# Patient Record
Sex: Male | Born: 1935 | Hispanic: No | Smoking: Former smoker
Health system: Southern US, Community
[De-identification: ages and names within clinical notes are randomized; demographics above are authoritative.]

## PROBLEM LIST (undated history)

## (undated) DIAGNOSIS — I10 Essential (primary) hypertension: Secondary | ICD-10-CM

## (undated) DIAGNOSIS — C801 Malignant (primary) neoplasm, unspecified: Secondary | ICD-10-CM

## (undated) HISTORY — PX: BLADDER SURGERY: SHX569

## (undated) HISTORY — PX: OTHER SURGICAL HISTORY: SHX169

---

## 2005-04-23 ENCOUNTER — Inpatient Hospital Stay (HOSPITAL_COMMUNITY): Admission: RE | Admit: 2005-04-23 | Discharge: 2005-04-26 | Payer: Self-pay | Admitting: Urology

## 2005-04-23 ENCOUNTER — Ambulatory Visit: Payer: Self-pay | Admitting: Internal Medicine

## 2010-12-19 ENCOUNTER — Encounter (INDEPENDENT_AMBULATORY_CARE_PROVIDER_SITE_OTHER): Payer: Medicare Other | Admitting: Ophthalmology

## 2010-12-19 DIAGNOSIS — H43819 Vitreous degeneration, unspecified eye: Secondary | ICD-10-CM

## 2010-12-19 DIAGNOSIS — H3530 Unspecified macular degeneration: Secondary | ICD-10-CM

## 2010-12-19 DIAGNOSIS — H353290 Exudative age-related macular degeneration, unspecified eye, stage unspecified: Secondary | ICD-10-CM

## 2011-02-27 ENCOUNTER — Encounter (INDEPENDENT_AMBULATORY_CARE_PROVIDER_SITE_OTHER): Payer: Medicare Other | Admitting: Ophthalmology

## 2011-02-27 DIAGNOSIS — H353290 Exudative age-related macular degeneration, unspecified eye, stage unspecified: Secondary | ICD-10-CM

## 2011-02-27 DIAGNOSIS — H3530 Unspecified macular degeneration: Secondary | ICD-10-CM

## 2011-02-27 DIAGNOSIS — H43819 Vitreous degeneration, unspecified eye: Secondary | ICD-10-CM

## 2011-05-08 ENCOUNTER — Encounter (INDEPENDENT_AMBULATORY_CARE_PROVIDER_SITE_OTHER): Payer: Medicare Other | Admitting: Ophthalmology

## 2011-05-09 ENCOUNTER — Encounter (INDEPENDENT_AMBULATORY_CARE_PROVIDER_SITE_OTHER): Payer: Medicare Other | Admitting: Ophthalmology

## 2011-05-09 DIAGNOSIS — H43819 Vitreous degeneration, unspecified eye: Secondary | ICD-10-CM

## 2011-05-09 DIAGNOSIS — H353290 Exudative age-related macular degeneration, unspecified eye, stage unspecified: Secondary | ICD-10-CM

## 2011-05-09 DIAGNOSIS — H3530 Unspecified macular degeneration: Secondary | ICD-10-CM

## 2011-07-18 ENCOUNTER — Encounter (INDEPENDENT_AMBULATORY_CARE_PROVIDER_SITE_OTHER): Payer: Medicare Other | Admitting: Ophthalmology

## 2011-07-18 DIAGNOSIS — H3530 Unspecified macular degeneration: Secondary | ICD-10-CM

## 2011-07-18 DIAGNOSIS — H353290 Exudative age-related macular degeneration, unspecified eye, stage unspecified: Secondary | ICD-10-CM

## 2011-07-18 DIAGNOSIS — H43819 Vitreous degeneration, unspecified eye: Secondary | ICD-10-CM

## 2011-09-19 ENCOUNTER — Encounter (INDEPENDENT_AMBULATORY_CARE_PROVIDER_SITE_OTHER): Payer: Medicare Other | Admitting: Ophthalmology

## 2011-09-19 DIAGNOSIS — H2700 Aphakia, unspecified eye: Secondary | ICD-10-CM

## 2011-09-19 DIAGNOSIS — H353290 Exudative age-related macular degeneration, unspecified eye, stage unspecified: Secondary | ICD-10-CM

## 2011-09-19 DIAGNOSIS — H43819 Vitreous degeneration, unspecified eye: Secondary | ICD-10-CM

## 2011-09-19 DIAGNOSIS — H3530 Unspecified macular degeneration: Secondary | ICD-10-CM

## 2011-11-21 ENCOUNTER — Encounter (INDEPENDENT_AMBULATORY_CARE_PROVIDER_SITE_OTHER): Payer: Medicare Other | Admitting: Ophthalmology

## 2011-11-21 DIAGNOSIS — H43819 Vitreous degeneration, unspecified eye: Secondary | ICD-10-CM

## 2011-11-21 DIAGNOSIS — H3530 Unspecified macular degeneration: Secondary | ICD-10-CM

## 2011-11-21 DIAGNOSIS — H353290 Exudative age-related macular degeneration, unspecified eye, stage unspecified: Secondary | ICD-10-CM

## 2012-01-23 ENCOUNTER — Encounter (INDEPENDENT_AMBULATORY_CARE_PROVIDER_SITE_OTHER): Payer: Medicare Other | Admitting: Ophthalmology

## 2012-01-23 DIAGNOSIS — H353290 Exudative age-related macular degeneration, unspecified eye, stage unspecified: Secondary | ICD-10-CM

## 2012-01-23 DIAGNOSIS — H3530 Unspecified macular degeneration: Secondary | ICD-10-CM

## 2012-01-23 DIAGNOSIS — H43819 Vitreous degeneration, unspecified eye: Secondary | ICD-10-CM

## 2012-03-26 ENCOUNTER — Encounter (INDEPENDENT_AMBULATORY_CARE_PROVIDER_SITE_OTHER): Payer: Medicare Other | Admitting: Ophthalmology

## 2012-03-26 DIAGNOSIS — H43819 Vitreous degeneration, unspecified eye: Secondary | ICD-10-CM

## 2012-03-26 DIAGNOSIS — H3530 Unspecified macular degeneration: Secondary | ICD-10-CM

## 2012-03-26 DIAGNOSIS — H353290 Exudative age-related macular degeneration, unspecified eye, stage unspecified: Secondary | ICD-10-CM

## 2012-05-28 ENCOUNTER — Encounter (INDEPENDENT_AMBULATORY_CARE_PROVIDER_SITE_OTHER): Payer: Medicare Other | Admitting: Ophthalmology

## 2012-05-28 DIAGNOSIS — H353290 Exudative age-related macular degeneration, unspecified eye, stage unspecified: Secondary | ICD-10-CM

## 2012-05-28 DIAGNOSIS — H43819 Vitreous degeneration, unspecified eye: Secondary | ICD-10-CM

## 2012-05-28 DIAGNOSIS — H3530 Unspecified macular degeneration: Secondary | ICD-10-CM

## 2012-07-30 ENCOUNTER — Encounter (INDEPENDENT_AMBULATORY_CARE_PROVIDER_SITE_OTHER): Payer: Medicare Other | Admitting: Ophthalmology

## 2012-08-06 ENCOUNTER — Encounter (INDEPENDENT_AMBULATORY_CARE_PROVIDER_SITE_OTHER): Payer: Medicare Other | Admitting: Ophthalmology

## 2012-08-06 DIAGNOSIS — H3530 Unspecified macular degeneration: Secondary | ICD-10-CM

## 2012-08-06 DIAGNOSIS — H353290 Exudative age-related macular degeneration, unspecified eye, stage unspecified: Secondary | ICD-10-CM

## 2012-10-15 ENCOUNTER — Encounter (INDEPENDENT_AMBULATORY_CARE_PROVIDER_SITE_OTHER): Payer: Medicare Other | Admitting: Ophthalmology

## 2012-10-15 DIAGNOSIS — H3530 Unspecified macular degeneration: Secondary | ICD-10-CM

## 2012-10-15 DIAGNOSIS — H353290 Exudative age-related macular degeneration, unspecified eye, stage unspecified: Secondary | ICD-10-CM

## 2012-10-15 DIAGNOSIS — H43819 Vitreous degeneration, unspecified eye: Secondary | ICD-10-CM

## 2012-12-24 ENCOUNTER — Encounter (INDEPENDENT_AMBULATORY_CARE_PROVIDER_SITE_OTHER): Payer: Medicare Other | Admitting: Ophthalmology

## 2012-12-24 DIAGNOSIS — H3530 Unspecified macular degeneration: Secondary | ICD-10-CM

## 2012-12-24 DIAGNOSIS — H43819 Vitreous degeneration, unspecified eye: Secondary | ICD-10-CM

## 2012-12-24 DIAGNOSIS — H353290 Exudative age-related macular degeneration, unspecified eye, stage unspecified: Secondary | ICD-10-CM

## 2012-12-29 ENCOUNTER — Encounter (INDEPENDENT_AMBULATORY_CARE_PROVIDER_SITE_OTHER): Payer: Medicare Other | Admitting: Ophthalmology

## 2013-03-04 ENCOUNTER — Encounter (INDEPENDENT_AMBULATORY_CARE_PROVIDER_SITE_OTHER): Payer: Medicare Other | Admitting: Ophthalmology

## 2013-03-04 DIAGNOSIS — H353290 Exudative age-related macular degeneration, unspecified eye, stage unspecified: Secondary | ICD-10-CM

## 2013-03-04 DIAGNOSIS — H3530 Unspecified macular degeneration: Secondary | ICD-10-CM

## 2013-03-04 DIAGNOSIS — H43819 Vitreous degeneration, unspecified eye: Secondary | ICD-10-CM

## 2013-05-13 ENCOUNTER — Encounter (INDEPENDENT_AMBULATORY_CARE_PROVIDER_SITE_OTHER): Payer: Medicare Other | Admitting: Ophthalmology

## 2013-05-13 DIAGNOSIS — H43819 Vitreous degeneration, unspecified eye: Secondary | ICD-10-CM

## 2013-05-13 DIAGNOSIS — H3530 Unspecified macular degeneration: Secondary | ICD-10-CM

## 2013-05-13 DIAGNOSIS — H353290 Exudative age-related macular degeneration, unspecified eye, stage unspecified: Secondary | ICD-10-CM

## 2013-07-26 ENCOUNTER — Emergency Department (HOSPITAL_COMMUNITY): Payer: Medicare Other

## 2013-07-26 ENCOUNTER — Inpatient Hospital Stay (HOSPITAL_COMMUNITY)
Admission: EM | Admit: 2013-07-26 | Discharge: 2013-07-28 | DRG: 066 | Disposition: A | Discharge status: Home / Self Care | Payer: Medicare Other | Attending: Internal Medicine | Admitting: Internal Medicine

## 2013-07-26 DIAGNOSIS — E785 Hyperlipidemia, unspecified: Secondary | ICD-10-CM | POA: Diagnosis present

## 2013-07-26 DIAGNOSIS — N289 Disorder of kidney and ureter, unspecified: Secondary | ICD-10-CM | POA: Diagnosis present

## 2013-07-26 DIAGNOSIS — Z66 Do not resuscitate: Secondary | ICD-10-CM | POA: Diagnosis present

## 2013-07-26 DIAGNOSIS — M7989 Other specified soft tissue disorders: Secondary | ICD-10-CM | POA: Diagnosis present

## 2013-07-26 DIAGNOSIS — Z8546 Personal history of malignant neoplasm of prostate: Secondary | ICD-10-CM

## 2013-07-26 DIAGNOSIS — I4891 Unspecified atrial fibrillation: Secondary | ICD-10-CM | POA: Diagnosis present

## 2013-07-26 DIAGNOSIS — I10 Essential (primary) hypertension: Secondary | ICD-10-CM | POA: Diagnosis present

## 2013-07-26 DIAGNOSIS — I639 Cerebral infarction, unspecified: Secondary | ICD-10-CM | POA: Diagnosis present

## 2013-07-26 DIAGNOSIS — I6350 Cerebral infarction due to unspecified occlusion or stenosis of unspecified cerebral artery: Principal | ICD-10-CM | POA: Diagnosis present

## 2013-07-26 DIAGNOSIS — Z87891 Personal history of nicotine dependence: Secondary | ICD-10-CM

## 2013-07-26 DIAGNOSIS — D649 Anemia, unspecified: Secondary | ICD-10-CM | POA: Diagnosis present

## 2013-07-26 HISTORY — DX: Essential (primary) hypertension: I10

## 2013-07-26 HISTORY — DX: Malignant (primary) neoplasm, unspecified: C80.1

## 2013-07-26 LAB — I-STAT CHEM 8, ED
BUN: 24 mg/dL — ABNORMAL HIGH (ref 6–23)
CHLORIDE: 97 meq/L (ref 96–112)
CREATININE: 1.5 mg/dL — AB (ref 0.50–1.35)
Calcium, Ion: 1.17 mmol/L (ref 1.13–1.30)
Glucose, Bld: 109 mg/dL — ABNORMAL HIGH (ref 70–99)
HEMATOCRIT: 34 % — AB (ref 39.0–52.0)
HEMOGLOBIN: 11.6 g/dL — AB (ref 13.0–17.0)
Potassium: 4.4 mEq/L (ref 3.7–5.3)
SODIUM: 137 meq/L (ref 137–147)
TCO2: 28 mmol/L (ref 0–100)

## 2013-07-26 LAB — I-STAT TROPONIN, ED: Troponin i, poc: 0 ng/mL (ref 0.00–0.08)

## 2013-07-26 NOTE — ED Notes (Signed)
Pt was watching TV around 830 last night, and was trying to change the channel while trying to watch the news.  He noticed he couldn't use his right hand.

## 2013-07-26 NOTE — ED Notes (Signed)
Dr. Florina Ou at the bedside.

## 2013-07-26 NOTE — ED Notes (Signed)
Dr. Kirkpatrick at the bedside.  

## 2013-07-26 NOTE — ED Notes (Signed)
Patient reporting feeling weak on right side since around 9pm.  Pt has drift on right upper arm.  Able to follow commands. ER MD at the bedside.

## 2013-07-26 NOTE — ED Provider Notes (Signed)
CSN: [id number]     Arrival date & time 07/26/13  2311 History   None    Chief Complaint  Patient presents with  . Code Stroke     (Consider location/radiation/quality/duration/timing/severity/associated sxs/prior Treatment) HPI This is a 80 year old male with a history of atrial fibrillation. He is not on any anticoagulants. He is here with right upper extremity weakness onset about 2 hours ago. He was watching TV and realized he could no longer operate the remote control. He states his right upper extremity also felt mom. Symptoms have somewhat resolved but he is not back to baseline. There is no specific exacerbating or mitigating factor. He denies headache, visual changes, nausea, vomiting or diarrhea. He denies chest pain or shortness of breath. His daughter states he has been less active the past few days than usual.  History reviewed. No pertinent past medical history. History reviewed. No pertinent past surgical history. History reviewed. No pertinent family history. History  Substance Use Topics  . Smoking status: Never Smoker   . Smokeless tobacco: Not on file  . Alcohol Use: No    Review of Systems  All other systems reviewed and are negative.   Allergies  Review of patient's allergies indicates no known allergies.  Home Medications   Prior to Admission medications   Medication Sig Start Date End Date Taking? Authorizing Provider  beta carotene w/minerals (OCUVITE) tablet Take 2 tablets by mouth daily.   Yes Historical Provider, MD   BP 178/88  Pulse 76  Temp(Src) 97.7 F (36.5 C) (Oral)  Resp 13  SpO2 99%  Physical Exam General: Well-developed, well-nourished male in no acute distress; appearance consistent with age of record HENT: normocephalic; atraumatic Eyes: Right pupil slightly larger than left pupil, pupils sluggish; extraocular muscles intact Neck: supple Heart: Irregular rhythm Lungs: clear to auscultation bilaterally Abdomen: soft; nondistended;  nontender; bowel sounds present Extremities: Arthritic changes; pulses normal Neurologic: Awake, alert and oriented; strength +5 out of 5 in lower extremities and left upper extremity, strength was 4/5 right upper extremity; no facial droop, tongue midline; right pronator drift Skin: Warm and dry Psychiatric: Normal mood and affect   ED Course  Procedures (including critical care time)  MDM    EKG Interpretation  Date/Time:  Monday Jul 26 2013 23:18:51 EDT Ventricular Rate:  89 PR Interval:    QRS Duration: 75 QT Interval:  351 QTC Calculation: 427 R Axis:   50 Text Interpretation:  Atrial fibrillation No significant change since Confirmed by Eugena Rhue  MD, Jenny Reichmann ([id number]) on 07/26/2013 11:24:37 PM      1:18 AM Code stroke paged at 11:24 PM. Neurologist at bedside.  Nursing notes and vitals signs, including pulse oximetry, reviewed.  Summary of this visit's results, reviewed by myself:  Labs:  Results for orders placed during the hospital encounter of 07/26/13 (from the past 24 hour(s))  ETHANOL     Status: None   Collection Time    07/26/13 11:42 PM      Result Value Ref Range   Alcohol, Ethyl (B) <11  0 - 11 mg/dL  PROTIME-INR     Status: None   Collection Time    07/26/13 11:42 PM      Result Value Ref Range   Prothrombin Time 13.1  11.6 - 15.2 seconds   INR 1.01  0.00 - 1.49  APTT     Status: None   Collection Time    07/26/13 11:42 PM      Result Value Ref Range  aPTT 33  24 - 37 seconds  CBC     Status: Abnormal   Collection Time    07/26/13 11:42 PM      Result Value Ref Range   WBC 5.5  4.0 - 10.5 K/uL   RBC 3.86 (*) 4.22 - 5.81 MIL/uL   Hemoglobin 10.3 (*) 13.0 - 17.0 g/dL   HCT 32.7 (*) 39.0 - 52.0 %   MCV 84.7  78.0 - 100.0 fL   MCH 26.7  26.0 - 34.0 pg   MCHC 31.5  30.0 - 36.0 g/dL   RDW 15.2  11.5 - 15.5 %   Platelets 261  150 - 400 K/uL  DIFFERENTIAL     Status: None   Collection Time    07/26/13 11:42 PM      Result Value Ref Range    Neutrophils Relative % 66  43 - 77 %   Neutro Abs 3.6  1.7 - 7.7 K/uL   Lymphocytes Relative 20  12 - 46 %   Lymphs Abs 1.1  0.7 - 4.0 K/uL   Monocytes Relative 11  3 - 12 %   Monocytes Absolute 0.6  0.1 - 1.0 K/uL   Eosinophils Relative 2  0 - 5 %   Eosinophils Absolute 0.1  0.0 - 0.7 K/uL   Basophils Relative 1  0 - 1 %   Basophils Absolute 0.1  0.0 - 0.1 K/uL  COMPREHENSIVE METABOLIC PANEL     Status: Abnormal   Collection Time    07/26/13 11:42 PM      Result Value Ref Range   Sodium 135 (*) 137 - 147 mEq/L   Potassium 4.3  3.7 - 5.3 mEq/L   Chloride 95 (*) 96 - 112 mEq/L   CO2 27  19 - 32 mEq/L   Glucose, Bld 106 (*) 70 - 99 mg/dL   BUN 21  6 - 23 mg/dL   Creatinine, Ser 1.23  0.50 - 1.35 mg/dL   Calcium 9.2  8.4 - 10.5 mg/dL   Total Protein 7.3  6.0 - 8.3 g/dL   Albumin 3.5  3.5 - 5.2 g/dL   AST 22  0 - 37 U/L   ALT 16  0 - 53 U/L   Alkaline Phosphatase 78  39 - 117 U/L   Total Bilirubin 0.4  0.3 - 1.2 mg/dL   GFR calc non Af Amer 49 (*) >90 mL/min   GFR calc Af Amer 57 (*) >90 mL/min  I-STAT TROPOININ, ED     Status: None   Collection Time    07/26/13 11:50 PM      Result Value Ref Range   Troponin i, poc 0.00  0.00 - 0.08 ng/mL   Comment 3           I-STAT CHEM 8, ED     Status: Abnormal   Collection Time    07/26/13 11:52 PM      Result Value Ref Range   Sodium 137  137 - 147 mEq/L   Potassium 4.4  3.7 - 5.3 mEq/L   Chloride 97  96 - 112 mEq/L   BUN 24 (*) 6 - 23 mg/dL   Creatinine, Ser 1.50 (*) 0.50 - 1.35 mg/dL   Glucose, Bld 109 (*) 70 - 99 mg/dL   Calcium, Ion 1.17  1.13 - 1.30 mmol/L   TCO2 28  0 - 100 mmol/L   Hemoglobin 11.6 (*) 13.0 - 17.0 g/dL   HCT 34.0 (*) 39.0 - 52.0 %  URINE RAPID DRUG SCREEN (HOSP PERFORMED)     Status: None   Collection Time    07/27/13 12:15 AM      Result Value Ref Range   Opiates NONE DETECTED  NONE DETECTED   Cocaine NONE DETECTED  NONE DETECTED   Benzodiazepines NONE DETECTED  NONE DETECTED   Amphetamines NONE  DETECTED  NONE DETECTED   Tetrahydrocannabinol NONE DETECTED  NONE DETECTED   Barbiturates NONE DETECTED  NONE DETECTED  URINALYSIS, ROUTINE W REFLEX MICROSCOPIC     Status: None   Collection Time    07/27/13 12:15 AM      Result Value Ref Range   Color, Urine YELLOW  YELLOW   APPearance CLEAR  CLEAR   Specific Gravity, Urine 1.009  1.005 - 1.030   pH 7.0  5.0 - 8.0   Glucose, UA NEGATIVE  NEGATIVE mg/dL   Hgb urine dipstick NEGATIVE  NEGATIVE   Bilirubin Urine NEGATIVE  NEGATIVE   Ketones, ur NEGATIVE  NEGATIVE mg/dL   Protein, ur NEGATIVE  NEGATIVE mg/dL   Urobilinogen, UA 1.0  0.0 - 1.0 mg/dL   Nitrite NEGATIVE  NEGATIVE   Leukocytes, UA NEGATIVE  NEGATIVE    Imaging Studies: Ct Head Wo Contrast  07/26/2013   CLINICAL DATA:  Code stroke.  Right-sided weakness.  EXAM: CT HEAD WITHOUT CONTRAST  TECHNIQUE: Contiguous axial images were obtained from the base of the skull through the vertex without intravenous contrast.  COMPARISON:  None.  FINDINGS: Skull and Sinuses:No acute osseous findings. Degenerative cystic change within the right mandibular condyle.  Orbits: Bilateral cataract resection.  Brain: No evidence of acute abnormality, such as acute infarction, hemorrhage, hydrocephalus, or mass lesion/mass effect. Chronic small vessel disease with ischemic gliosis confluent around the lateral ventricles. Lacunar insults in the posterior right internal capsule and left putamen. Generalized cerebral volume loss, age appropriate.  These results were called by telephone at the time of interpretation on 07/26/2013 at 11:42 PM to Dr. Leonel Ramsay, who verbally acknowledged these results.  IMPRESSION: 1. Negative for intracranial hemorrhage or major vessel infarct. 2. Chronic small vessel disease.   Electronically Signed   By: Jorje Guild M.D.   On: 07/26/2013 23:49       Wynetta Fines, MD 07/27/13 [phone]

## 2013-07-26 NOTE — ED Notes (Signed)
Patient returned from CT. Phlebotomy at the bedside.  

## 2013-07-27 ENCOUNTER — Inpatient Hospital Stay (HOSPITAL_COMMUNITY): Payer: Medicare Other

## 2013-07-27 ENCOUNTER — Encounter (HOSPITAL_COMMUNITY): Payer: Self-pay | Admitting: Emergency Medicine

## 2013-07-27 DIAGNOSIS — I6350 Cerebral infarction due to unspecified occlusion or stenosis of unspecified cerebral artery: Principal | ICD-10-CM

## 2013-07-27 DIAGNOSIS — I359 Nonrheumatic aortic valve disorder, unspecified: Secondary | ICD-10-CM

## 2013-07-27 DIAGNOSIS — I4891 Unspecified atrial fibrillation: Secondary | ICD-10-CM

## 2013-07-27 DIAGNOSIS — D649 Anemia, unspecified: Secondary | ICD-10-CM

## 2013-07-27 DIAGNOSIS — M7989 Other specified soft tissue disorders: Secondary | ICD-10-CM | POA: Diagnosis present

## 2013-07-27 DIAGNOSIS — I10 Essential (primary) hypertension: Secondary | ICD-10-CM | POA: Diagnosis present

## 2013-07-27 DIAGNOSIS — I639 Cerebral infarction, unspecified: Secondary | ICD-10-CM | POA: Diagnosis present

## 2013-07-27 LAB — COMPREHENSIVE METABOLIC PANEL
ALBUMIN: 3.5 g/dL (ref 3.5–5.2)
ALK PHOS: 78 U/L (ref 39–117)
ALT: 16 U/L (ref 0–53)
AST: 22 U/L (ref 0–37)
BUN: 21 mg/dL (ref 6–23)
CALCIUM: 9.2 mg/dL (ref 8.4–10.5)
CHLORIDE: 95 meq/L — AB (ref 96–112)
CO2: 27 meq/L (ref 19–32)
Creatinine, Ser: 1.23 mg/dL (ref 0.50–1.35)
GFR, EST AFRICAN AMERICAN: 57 mL/min — AB (ref >90)
GFR, EST NON AFRICAN AMERICAN: 49 mL/min — AB (ref >90)
Glucose, Bld: 106 mg/dL — ABNORMAL HIGH (ref 70–99)
Potassium: 4.3 mEq/L (ref 3.7–5.3)
SODIUM: 135 meq/L — AB (ref 137–147)
Total Bilirubin: 0.4 mg/dL (ref 0.3–1.2)
Total Protein: 7.3 g/dL (ref 6.0–8.3)

## 2013-07-27 LAB — BASIC METABOLIC PANEL
BUN: 24 mg/dL — ABNORMAL HIGH (ref 6–23)
CALCIUM: 8.8 mg/dL (ref 8.4–10.5)
CO2: 24 meq/L (ref 19–32)
Chloride: 98 mEq/L (ref 96–112)
Creatinine, Ser: 1.17 mg/dL (ref 0.50–1.35)
GFR calc Af Amer: 60 mL/min — ABNORMAL LOW (ref >90)
GFR, EST NON AFRICAN AMERICAN: 52 mL/min — AB (ref >90)
Glucose, Bld: 159 mg/dL — ABNORMAL HIGH (ref 70–99)
Potassium: 4.7 mEq/L (ref 3.7–5.3)
SODIUM: 135 meq/L — AB (ref 137–147)

## 2013-07-27 LAB — ETHANOL: Alcohol, Ethyl (B): <11 mg/dL (ref 0–11)

## 2013-07-27 LAB — URINALYSIS, ROUTINE W REFLEX MICROSCOPIC
Bilirubin Urine: NEGATIVE
Glucose, UA: NEGATIVE mg/dL
HGB URINE DIPSTICK: NEGATIVE
Ketones, ur: NEGATIVE mg/dL
Leukocytes, UA: NEGATIVE
Nitrite: NEGATIVE
PH: 7 (ref 5.0–8.0)
PROTEIN: NEGATIVE mg/dL
SPECIFIC GRAVITY, URINE: 1.009 (ref 1.005–1.030)
Urobilinogen, UA: 1 mg/dL (ref 0.0–1.0)

## 2013-07-27 LAB — CBC
HCT: 32.7 % — ABNORMAL LOW (ref 39.0–52.0)
Hemoglobin: 10.3 g/dL — ABNORMAL LOW (ref 13.0–17.0)
MCH: 26.7 pg (ref 26.0–34.0)
MCHC: 31.5 g/dL (ref 30.0–36.0)
MCV: 84.7 fL (ref 78.0–100.0)
Platelets: 261 10*3/uL (ref 150–400)
RBC: 3.86 MIL/uL — ABNORMAL LOW (ref 4.22–5.81)
RDW: 15.2 % (ref 11.5–15.5)
WBC: 5.5 10*3/uL (ref 4.0–10.5)

## 2013-07-27 LAB — HEMOGLOBIN A1C
Hgb A1c MFr Bld: 6 % — ABNORMAL HIGH (ref <5.7)
Mean Plasma Glucose: 126 mg/dL — ABNORMAL HIGH (ref <117)

## 2013-07-27 LAB — DIFFERENTIAL
BASOS ABS: 0.1 10*3/uL (ref 0.0–0.1)
BASOS PCT: 1 % (ref 0–1)
Eosinophils Absolute: 0.1 10*3/uL (ref 0.0–0.7)
Eosinophils Relative: 2 % (ref 0–5)
Lymphocytes Relative: 20 % (ref 12–46)
Lymphs Abs: 1.1 10*3/uL (ref 0.7–4.0)
Monocytes Absolute: 0.6 10*3/uL (ref 0.1–1.0)
Monocytes Relative: 11 % (ref 3–12)
NEUTROS ABS: 3.6 10*3/uL (ref 1.7–7.7)
NEUTROS PCT: 66 % (ref 43–77)

## 2013-07-27 LAB — TROPONIN I: Troponin I: <0.3 ng/mL (ref <0.30)

## 2013-07-27 LAB — LIPID PANEL
Cholesterol: 125 mg/dL (ref 0–200)
HDL: 33 mg/dL — ABNORMAL LOW (ref >39)
LDL Cholesterol: 78 mg/dL (ref 0–99)
Total CHOL/HDL Ratio: 3.8 RATIO
Triglycerides: 72 mg/dL (ref <150)
VLDL: 14 mg/dL (ref 0–40)

## 2013-07-27 LAB — FERRITIN: Ferritin: 60 ng/mL (ref 22–322)

## 2013-07-27 LAB — RAPID URINE DRUG SCREEN, HOSP PERFORMED
AMPHETAMINES: NOT DETECTED
BENZODIAZEPINES: NOT DETECTED
Barbiturates: NOT DETECTED
COCAINE: NOT DETECTED
OPIATES: NOT DETECTED
TETRAHYDROCANNABINOL: NOT DETECTED

## 2013-07-27 LAB — TSH: TSH: 3.58 u[IU]/mL (ref 0.350–4.500)

## 2013-07-27 LAB — APTT: APTT: 33 s (ref 24–37)

## 2013-07-27 LAB — PROTIME-INR
INR: 1.01 (ref 0.00–1.49)
Prothrombin Time: 13.1 seconds (ref 11.6–15.2)

## 2013-07-27 LAB — RETICULOCYTES
RBC.: 3.79 MIL/uL — ABNORMAL LOW (ref 4.22–5.81)
RETIC COUNT ABSOLUTE: 79.6 10*3/uL (ref 19.0–186.0)
Retic Ct Pct: 2.1 % (ref 0.4–3.1)

## 2013-07-27 LAB — VITAMIN B12: Vitamin B-12: 438 pg/mL (ref 211–911)

## 2013-07-27 LAB — FOLATE

## 2013-07-27 MED ORDER — ASPIRIN EC 325 MG PO TBEC
325.0000 mg | DELAYED_RELEASE_TABLET | Freq: Every day | ORAL | Status: DC
  Administered 2013-07-27: 325 mg via ORAL
  Filled 2013-07-27: qty 1

## 2013-07-27 MED ORDER — LABETALOL HCL 5 MG/ML IV SOLN
5.0000 mg | Freq: Once | INTRAVENOUS | Status: AC
  Administered 2013-07-27: 5 mg via INTRAVENOUS
  Filled 2013-07-27: qty 4

## 2013-07-27 MED ORDER — METOPROLOL TARTRATE 12.5 MG HALF TABLET
12.5000 mg | ORAL_TABLET | Freq: Two times a day (BID) | ORAL | Status: DC
  Administered 2013-07-27 – 2013-07-28 (×3): 12.5 mg via ORAL
  Filled 2013-07-27 (×5): qty 1

## 2013-07-27 MED ORDER — ACETAMINOPHEN 650 MG RE SUPP: 650.0000 mg | RECTAL | Status: DC | PRN

## 2013-07-27 MED ORDER — SODIUM CHLORIDE 0.9 % IV SOLN
INTRAVENOUS | Status: DC
  Administered 2013-07-27: 07:00:00 via INTRAVENOUS

## 2013-07-27 MED ORDER — ACETAMINOPHEN 325 MG PO TABS: 650.0000 mg | ORAL_TABLET | ORAL | Status: DC | PRN

## 2013-07-27 MED ORDER — ASPIRIN 325 MG PO TABS
325.0000 mg | ORAL_TABLET | Freq: Every day | ORAL | Status: DC
  Filled 2013-07-27: qty 1

## 2013-07-27 MED ORDER — SENNOSIDES-DOCUSATE SODIUM 8.6-50 MG PO TABS
1.0000 | ORAL_TABLET | Freq: Every evening | ORAL | Status: DC | PRN
  Filled 2013-07-27: qty 1

## 2013-07-27 MED ORDER — APIXABAN 2.5 MG PO TABS
2.5000 mg | ORAL_TABLET | Freq: Two times a day (BID) | ORAL | Status: DC
  Administered 2013-07-27 – 2013-07-28 (×3): 2.5 mg via ORAL
  Filled 2013-07-27 (×4): qty 1

## 2013-07-27 MED ORDER — ASPIRIN 300 MG RE SUPP
300.0000 mg | Freq: Every day | RECTAL | Status: DC
  Filled 2013-07-27: qty 1

## 2013-07-27 NOTE — Consult Note (Addendum)
Neurology Consultation Reason for Consult: Right sided weakness Referring Physician: Molpus, J  CC: Right sided weakness  History is obtained from:patient, family  HPI: Manuel Armstrong is a 80 y.o. male with a history of an irregular heart beat who presents with sudden onset right sided weakness at 8:30pm this evening. The patient states that this has improved mildly since onset. He denies any vision changes(though baseline impatired vision in left eye) or other symptoms.    LKW: 8:30 pm  tpa given?: no, mild symptoms, though arrived within window, not within reasonable amount of time to give IV tpa.     ROS: A 14 point ROS was performed and is negative except as noted in the HPI.  PMH: Irregular heart beat.   Family History: No hx similar  Social History: Tob: previous smoker  Exam: Current vital signs: BP 180/100  Pulse 79  Temp(Src) 98.3 F (36.8 C) (Oral)  Resp 19  SpO2 95% Vital signs in last 24 hours: Temp:  [98.3 F (36.8 C)] 98.3 F (36.8 C) (05/04 2326) Pulse Rate:  [71-87] 79 (05/05 0015) Resp:  [13-21] 19 (05/05 0015) BP: (170-193)/(98-104) 180/100 mmHg (05/05 0015) SpO2:  [95 %-100 %] 95 % (05/05 0015) FiO2 (%):  [21 %] 21 % (05/04 2323)  General: inbed, NAD CV: irregular Mental Status: Patient is awake, alert, oriented to person, place, month, year, and situation. Immediate and remote memory are intact. Patient is able to give a clear and coherent history. No signs of aphasia or neglect Cranial Nerves: II: Visual Fields are full. Pupils are equal, round, and reactive to light.  Discs are difficult to visualize. III,IV, VI: EOMI without ptosis or diploplia.  V: Facial sensation is decreased on left to temp VII: Facial movement is symmetric.  VIII: hearing is intact to voice X: Uvula elevates symmetrically XI: Shoulder shrug is symmetric. XII: tongue is midline without atrophy or fasciculations.  Motor: Tone is normal. Bulk is normal. 5/5  strength was present on the right, on the left, he has 4/5 weakness in the right arm and 4+/5 in the right leg.  Sensory: Sensation is decreased to temp on the right.  Deep Tendon Reflexes: 2+ and symmetric in the biceps and patellae.  Cerebellar: FNF and hks consistent with weakness on right, intact on left.  Gait: Not assessed due to acute nature of evaluation and multiple medical monitors in ED setting.   I have reviewed labs in epic and the results pertinent to this consultation are: Mildly elevated creatinine.   I have reviewed the images obtained:CT head - negative  Impression: 80 yo M with "irregular heart beat" and likely new stroke. He has an irregular heart beat, but I think I see p-wave on the monitor with frequent ectopy. Formal ECG pending. He will need a stroke workup.   Recommendations: 1. HgbA1c, fasting lipid panel 2. MRI, MRA  of the brain without contrast 3. Frequent neuro checks 4. Echocardiogram 5. Carotid dopplers 6. Prophylactic therapy-Antiplatelet med: Aspirin - dose 325mg  PO or 300mg  PR 7. Risk factor modification 8. Telemetry monitoring 9. PT consult, OT consult, Speech consult 10. Permissive hypertension.    Roland Rack, MD [facility] [phone]  If 7pm- 7am, please page neurology on call as listed in [place].

## 2013-07-27 NOTE — H&P (Signed)
PCP: Katherina Mires  Chief Complaint:  Right side weakness  HPI: Manuel Armstrong is a 80 y.o. male   has a past medical history of Hypertension and Cancer.   Presented with  Patient was watching TV around 8:30 pm he noted that his right hand was not working. He went to sleep and when he tried to get up he was very dizzy and his right leg was not working this was at 10:10pm EMS was called and he was brought to ER. Initially code stroke was called but was cancelled as patient was out of the window for tPA and was improving. His leg still feels weak. Denies any slured speech.  Patient was noted to be slightly anemic his family states he has been giving blood regularly but in the past they have prevented him from doing so due to low iron, denies any blood in stool no melena  Hospitalist was called for admission for  CVA work up   Review of Systems:    Pertinent positives include:   localizing neurological complaints,  weakness,  Constitutional:  No weight loss, night sweats, Fevers, chills, fatigue, weight loss  HEENT:  No headaches, Difficulty swallowing,Tooth/dental problems,Sore throat,  No sneezing, itching, ear ache, nasal congestion, post nasal drip,  Cardio-vascular:  No chest pain, Orthopnea, PND, anasarca, dizziness, palpitations.no Bilateral lower extremity swelling  GI:  No heartburn, indigestion, abdominal pain, nausea, vomiting, diarrhea, change in bowel habits, loss of appetite, melena, blood in stool, hematemesis Resp:  no shortness of breath at rest. No dyspnea on exertion, No excess mucus, no productive cough, No non-productive cough, No coughing up of blood.No change in color of mucus.No wheezing. Skin:  no rash or lesions. No jaundice GU:  no dysuria, change in color of urine, no urgency or frequency. No straining to urinate.  No flank pain.  Musculoskeletal:  No joint pain or no joint swelling. No decreased range of motion. No back pain.  Psych:  No change in mood  or affect. No depression or anxiety. No memory loss.  Neuro: no no double vision, no gait abnormality, no slurred speech, no confusion  Otherwise ROS are negative except for above, 10 systems were reviewed  Past Medical History: Past Medical History  Diagnosis Date  . Hypertension   . Cancer    Past Surgical History  Procedure Laterality Date  . Prostate cancer    . Bladder surgery       Medications: Prior to Admission medications   Medication Sig Start Date End Date Taking? Authorizing Provider  beta carotene w/minerals (OCUVITE) tablet Take 2 tablets by mouth daily.   Yes Historical Provider, MD    Allergies:  No Known Allergies  Social History:  Ambulatory  independently  Does a lot of yard work Lives at home   With family   reports that he has quit smoking. He does not have any smokeless tobacco history on file. He reports that he drinks alcohol. He reports that he does not use illicit drugs.    Family History: family history includes Hypertension in his mother; Lung cancer in his brother; Stroke in his mother.    Physical Exam: Patient Vitals for the past 24 hrs:  BP Temp Temp src Pulse Resp SpO2  07/27/13 0115 174/107 mmHg - - - 26 94 %  07/27/13 0111 178/88 mmHg - - 76 13 99 %  07/27/13 0100 167/138 mmHg - - 66 16 98 %  07/27/13 0045 171/86 mmHg - - 69 22 100 %  07/27/13 0043 - 97.7 F (36.5 C) - - - -  07/27/13 0030 175/85 mmHg - - 71 18 100 %  07/27/13 0015 180/100 mmHg - - 79 19 95 %  07/27/13 0000 170/98 mmHg - - 71 19 99 %  07/26/13 2345 180/103 mmHg - - 72 13 100 %  07/26/13 2326 193/104 mmHg 98.3 F (36.8 C) Oral 87 21 100 %  07/26/13 2323 - - - - - 100 %  07/26/13 2320 193/104 mmHg 98.3 F (36.8 C) Oral 86 21 100 %    1. General:  in No Acute distress 2. Psychological: Alert and  Oriented 3. Head/ENT:    Dry Mucous Membranes                          Head Non traumatic, neck supple                          Normal  Dentition 4. SKIN: normal   Skin turgor,  Skin clean Dry and intact no rash 5. Heart: Regular rate and rhythm no Murmur, Rub or gallop 6. Lungs:  no wheezes or crackles  Diminished air movement 7. Abdomen: Soft, non-tender, Non distended 8. Lower extremities: no clubbing, cyanosis, Right leg >left edema 9. Neurologically right side weakness noted, right pronator drift 10. MSK: Normal range of motion  body mass index is unknown because there is no height or weight on file.   Labs on Admission:   Recent Labs  07/26/13 2342 07/26/13 2352  NA 135* 137  K 4.3 4.4  CL 95* 97  CO2 27  --   GLUCOSE 106* 109*  BUN 21 24*  CREATININE 1.23 1.50*  CALCIUM 9.2  --     Recent Labs  07/26/13 2342  AST 22  ALT 16  ALKPHOS 78  BILITOT 0.4  PROT 7.3  ALBUMIN 3.5   No results found for this basename: LIPASE, AMYLASE,  in the last 72 hours  Recent Labs  07/26/13 2342 07/26/13 2352  WBC 5.5  --   NEUTROABS 3.6  --   HGB 10.3* 11.6*  HCT 32.7* 34.0*  MCV 84.7  --   PLT 261  --    No results found for this basename: CKTOTAL, CKMB, CKMBINDEX, TROPONINI,  in the last 72 hours No results found for this basename: TSH, T4TOTAL, FREET3, T3FREE, THYROIDAB,  in the last 72 hours No results found for this basename: VITAMINB12, FOLATE, FERRITIN, TIBC, IRON, RETICCTPCT,  in the last 72 hours No results found for this basename: HGBA1C    CrCl is unknown because there is no height on file for the current visit. ABG    Component Value Date/Time   TCO2 28 07/26/2013 2352     No results found for this basename: DDIMER     Other results:  I have pearsonaly reviewed this: ECG REPORT  Rate: 89  Rhythm: A.fib ST&T Change: no ischemic changes   UA no evidence of infection  BNP (last 3 results) No results found for this basename: PROBNP,  in the last 8760 hours  There were no vitals filed for this visit.   Cultures: No results found for this basename: sdes, specrequest, cult, reptstatus     Radiological  Exams on Admission: Ct Head Wo Contrast  07/26/2013   CLINICAL DATA:  Code stroke.  Right-sided weakness.  EXAM: CT HEAD WITHOUT CONTRAST  TECHNIQUE: Contiguous axial images were  obtained from the base of the skull through the vertex without intravenous contrast.  COMPARISON:  None.  FINDINGS: Skull and Sinuses:No acute osseous findings. Degenerative cystic change within the right mandibular condyle.  Orbits: Bilateral cataract resection.  Brain: No evidence of acute abnormality, such as acute infarction, hemorrhage, hydrocephalus, or mass lesion/mass effect. Chronic small vessel disease with ischemic gliosis confluent around the lateral ventricles. Lacunar insults in the posterior right internal capsule and left putamen. Generalized cerebral volume loss, age appropriate.  These results were called by telephone at the time of interpretation on 07/26/2013 at 11:42 PM to Dr. Leonel Ramsay, who verbally acknowledged these results.  IMPRESSION: 1. Negative for intracranial hemorrhage or major vessel infarct. 2. Chronic small vessel disease.   Electronically Signed   By: Jorje Guild M.D.   On: 07/26/2013 23:49    Chart has been reviewed  Assessment/Plan  80 yo M here with likely CVA, untreated HTN and anemia with question of A.fib  Present on Admission:  . CVA (cerebral infarction) -  - will admit based on TIA/CVA protocol, await results of MRA/MRI, Carotid Doppler and Echo, obtain cardiac enzymes,  ECG,  Lipid panel, TSH. Order PT/OT evaluation. Will make sure patient is on antiplatelet agent.  Neurology consult is aware.     . Anemia -obtain anemia panel, hemoccult stool . Hypertension - start on betablocker . Atrial fibrillation - ECG has been reviewed by cardiology Fellow on call who agrees that it is likely A.fib. Given CVA will hold off on imidiate anticoagulation but it is indicate whenever is safe per Neurology, obtain echo, TSH and cycle CE . Leg swelling - obtain doppler to RO  DVT   Prophylaxis: SCD   CODE STATUS:    DNR/DNI  Other plan as per orders.  I have spent a total of 55 min on this admission  Manuel Armstrong 07/27/2013, 1:27 AM

## 2013-07-27 NOTE — ED Notes (Signed)
Dr. Roel Cluck (hospitalist) at the bedside.

## 2013-07-27 NOTE — Discharge Instructions (Addendum)
Information on my medicine - ELIQUIS (apixaban)  This medication education was reviewed with me or my healthcare representative as part of my discharge preparation.  The pharmacist that spoke with me during my hospital stay was:  Lavenia Atlas, [facility]  Why was Eliquis prescribed for you? Eliquis was prescribed for you to reduce the risk of a blood clot forming that can cause a stroke if you have a medical condition called atrial fibrillation (a type of irregular heartbeat).  What do You need to know about Eliquis ? Take your Eliquis TWICE DAILY - one tablet in the morning and one tablet in the evening with or without food. If you have difficulty swallowing the tablet whole please discuss with your pharmacist how to take the medication safely.  Take Eliquis exactly as prescribed by your doctor and DO NOT stop taking Eliquis without talking to the doctor who prescribed the medication.  Stopping may increase your risk of developing a stroke.  Refill your prescription before you run out.  After discharge, you should have regular check-up appointments with your healthcare provider that is prescribing your Eliquis.  In the future your dose may need to be changed if your kidney function or weight changes by a significant amount or as you get older.  What do you do if you miss a dose? If you miss a dose, take it as soon as you remember on the same day and resume taking twice daily.  Do not take more than one dose of ELIQUIS at the same time to make up a missed dose.  Important Safety Information A possible side effect of Eliquis is bleeding. You should call your healthcare provider right away if you experience any of the following:   Bleeding from an injury or your nose that does not stop.   Unusual colored urine (red or dark brown) or unusual colored stools (red or black).   Unusual bruising for unknown reasons.   A serious fall or if you hit your head (even if there is no  bleeding).  Some medicines may interact with Eliquis and might increase your risk of bleeding or clotting while on Eliquis. To help avoid this, consult your healthcare provider or pharmacist prior to using any new prescription or non-prescription medications, including herbals, vitamins, non-steroidal anti-inflammatory drugs (NSAIDs) and supplements.  This website has more information on Eliquis (apixaban): [email or web address]. STROKE/TIA DISCHARGE INSTRUCTIONS SMOKING Cigarette smoking nearly doubles your risk of having a stroke & is the single most alterable risk factor  If you smoke or have smoked in the last 12 months, you are advised to quit smoking for your health.  Most of the excess cardiovascular risk related to smoking disappears within a year of stopping.  Ask you doctor about anti-smoking medications  [place] Quit Line: 1-800-QUIT NOW  Free Smoking Cessation Classes [phone]  CHOLESTEROL Know your levels; limit fat & cholesterol in your diet  Lipid Panel     Component Value Date/Time   CHOL 125 07/27/2013 0635   TRIG 72 07/27/2013 0635   HDL 33* 07/27/2013 0635   CHOLHDL 3.8 07/27/2013 0635   VLDL 14 07/27/2013 0635   LDLCALC 78 07/27/2013 0635      Many patients benefit from treatment even if their cholesterol is at goal.  Goal: Total Cholesterol (CHOL) less than 160  Goal:  Triglycerides (TRIG) less than 150  Goal:  HDL greater than 40  Goal:  LDL (LDLCALC) less than 100   BLOOD PRESSURE American Stroke Association  blood pressure target is less that 120/80 mm/Hg  Your discharge blood pressure is:  BP: 135/100 mmHg  Monitor your blood pressure  Limit your salt and alcohol intake  Many individuals will require more than one medication for high blood pressure  DIABETES (A1c is a blood sugar average for last 3 months) Goal HGBA1c is under 7% (HBGA1c is blood sugar average for last 3 months)  Diabetes: No known diagnosis of diabetes    Lab Results  Component Value Date    HGBA1C 6.0* 07/27/2013     Your HGBA1c can be lowered with medications, healthy diet, and exercise.  Check your blood sugar as directed by your physician  Call your physician if you experience unexplained or low blood sugars.  PHYSICAL ACTIVITY/REHABILITATION Goal is 30 minutes at least 4 days per week  Activity: No driving, Therapies: Physical Therapy: no follow up Return to work: N/A  Activity decreases your risk of heart attack and stroke and makes your heart stronger.  It helps control your weight and blood pressure; helps you relax and can improve your mood.  Participate in a regular exercise program.  Talk with your doctor about the best form of exercise for you (dancing, walking, swimming, cycling).  DIET/WEIGHT Goal is to maintain a healthy weight  Your discharge diet is: Cardiac thin liquids Your height is:  Height: 5\' 11"  (180.3 cm) Your current weight is: Weight: 64.32 kg (141 lb 12.8 oz) Your Body Mass Index (BMI) is:  BMI (Calculated): 19.6  Following the type of diet specifically designed for you will help prevent another stroke.  Your goal weight range is:  155-189  Your goal Body Mass Index (BMI) is 19-24.  Healthy food habits can help reduce 3 risk factors for stroke:  High cholesterol, hypertension, and excess weight.  RESOURCES Stroke/Support Group:  Call [phone]   STROKE EDUCATION PROVIDED/REVIEWED AND GIVEN TO PATIENT Stroke warning signs and symptoms How to activate emergency medical system (call 911). Medications prescribed at discharge. Need for follow-up after discharge. Personal risk factors for stroke. Pneumonia vaccine given: No Flu vaccine given: No My questions have been answered, the writing is legible, and I understand these instructions.  I will adhere to these goals & educational materials that have been provided to me after my discharge from the hospital.

## 2013-07-27 NOTE — Progress Notes (Signed)
Stroke Team Progress Note  HISTORY Manuel Armstrong is a 81 y.o. male with a history of an irregular heart beat who presents with sudden onset right sided weakness at 8:30pm this evening 07/26/2013. The patient states that this has improved mildly since onset. He denies any vision changes(though baseline impatired vision in left eye) or other symptoms. Patient was not administered TPA secondary to mild symptoms, though arrived within window, not within reasonable amount of time to give IV tpa. He was admitted for further evaluation and treatment.  SUBJECTIVE His NT is at the bedside.  Overall he feels his condition is stable.   OBJECTIVE Most recent Vital Signs: Filed Vitals:   07/27/13 0130 07/27/13 0200 07/27/13 0400 07/27/13 0600  BP: 185/102 179/108 150/86 168/80  Pulse: 80 69 60 77  Temp:  97.8 F (36.6 C) 98.4 F (36.9 C) 98 F (36.7 C)  TempSrc:      Resp: 19 20 18 18   Height:  5\' 11"  (1.803 m)    Weight:  63.73 kg (140 lb 8 oz)    SpO2: 94% 97% 96% 94%   CBG (last 3)  No results found for this basename: GLUCAP,  in the last 72 hours  IV Fluid Intake:   . sodium chloride 75 mL/hr at 07/27/13 0639    MEDICATIONS  . aspirin  300 mg Rectal Daily   Or  . aspirin  325 mg Oral Daily  . metoprolol tartrate  12.5 mg Oral BID   PRN:  acetaminophen, acetaminophen, senna-docusate  Diet:  Cardiac thin liquids Activity:  OOB with assistance DVT Prophylaxis:  SCDs  CLINICALLY SIGNIFICANT STUDIES Basic Metabolic Panel:   Recent Labs Lab 07/26/13 2342 07/26/13 2352  NA 135* 137  K 4.3 4.4  CL 95* 97  CO2 27  --   GLUCOSE 106* 109*  BUN 21 24*  CREATININE 1.23 1.50*  CALCIUM 9.2  --    Liver Function Tests:   Recent Labs Lab 07/26/13 2342  AST 22  ALT 16  ALKPHOS 78  BILITOT 0.4  PROT 7.3  ALBUMIN 3.5   CBC:   Recent Labs Lab 07/26/13 2342 07/26/13 2352  WBC 5.5  --   NEUTROABS 3.6  --   HGB 10.3* 11.6*  HCT 32.7* 34.0*  MCV 84.7  --   PLT 261  --     Coagulation:   Recent Labs Lab 07/26/13 2342  LABPROT 13.1  INR 1.01   Cardiac Enzymes:   Recent Labs Lab 07/27/13 0635  TROPONINI <0.30   Urinalysis:   Recent Labs Lab 07/27/13 0015  COLORURINE YELLOW  LABSPEC 1.009  PHURINE 7.0  GLUCOSEU NEGATIVE  HGBUR NEGATIVE  BILIRUBINUR NEGATIVE  KETONESUR NEGATIVE  PROTEINUR NEGATIVE  UROBILINOGEN 1.0  NITRITE NEGATIVE  LEUKOCYTESUR NEGATIVE   Lipid Panel    Component Value Date/Time   CHOL 125 07/27/2013 0635   TRIG 72 07/27/2013 0635   HDL 33* 07/27/2013 0635   CHOLHDL 3.8 07/27/2013 0635   VLDL 14 07/27/2013 0635   LDLCALC 78 07/27/2013 0635   HgbA1C  No results found for this basename: HGBA1C    Urine Drug Screen:     Component Value Date/Time   LABOPIA NONE DETECTED 07/27/2013 0015   COCAINSCRNUR NONE DETECTED 07/27/2013 0015   LABBENZ NONE DETECTED 07/27/2013 0015   AMPHETMU NONE DETECTED 07/27/2013 0015   THCU NONE DETECTED 07/27/2013 0015   LABBARB NONE DETECTED 07/27/2013 0015    Alcohol Level:   Recent Labs Lab 07/26/13  Viola <11    CT of the brain  07/26/2013    1. Negative for intracranial hemorrhage or major vessel infarct. 2. Chronic small vessel disease.  MRI of the brain    MRA of the brain    2D Echocardiogram    Carotid Doppler    CXR    EKG  atrial fibrillation, rate 89. For complete results please see formal report.   Therapy Recommendations   Physical Exam   Pleasant elderly male not in distress.Awake alert. Afebrile. Head is nontraumatic. Neck is supple without bruit. Hearing is normal. Cardiac exam no murmur or gallop. Lungs are clear to auscultation. Distal pulses are well felt. Neurological Exam ;  Awake  Alert oriented x 3. Normal speech and language.eye movements full without nystagmus.fundi were not visualized. Vision acuity and fields appear normal. Hearing is normal. Palatal movements are normal. Face symmetric. Tongue midline. Normal strength, tone, reflexes and coordination.  Normal sensation. Gait deferred. ASSESSMENT Manuel Armstrong is a 80 y.o. male presenting with right sided weakness. Imaging pending. Etiology of symptoms felt to be embolic secondary to known atrial fibrillation.  On no antithrombotics prior to admission. Now on aspirin 325 mg orally every day for secondary stroke prevention. Patient with no resultant deficits. Stroke work up underway.   atrial fibrillation  hypertension  LDL 78  Cancer  Creatinine up to 1.5 today  Hospital day # 1  TREATMENT/PLAN  Recommend change to eliquis for secondary stroke prevention.  Complete stroke workup:  MRI MRA, 2D, carotid dopplers  Therapy evals  F/u renal function at primary care MD in a few days  Follow up Dr. Leonie Man in 2 months   Burnetta Sabin, MSN, RN, ANVP-BC, AGPCNP-BC [facility] Pager: [phone] 07/27/2013 8:45 AM  I have personally obtained a history, examined the patient, evaluated imaging results, and formulated the assessment and plan of care. I agree with the above. Antony Contras, MD   To contact Stroke Continuity provider, please refer to [email or web address] After hours, contact General Neurology

## 2013-07-27 NOTE — ED Notes (Signed)
Discussed plan of care with Dr. Roel Cluck.  BP around 001 systolic is ok for patient to continue proper perfusion.

## 2013-07-27 NOTE — ED Notes (Addendum)
Discussed plan of care with Dr. Roel Cluck, hospitalist.  The patient's blood pressure systolic of 962 and will like parameters for receiving nurse.  MD will monitor, reports patient is not a full code.

## 2013-07-27 NOTE — Progress Notes (Addendum)
*  PRELIMINARY RESULTS* Vascular Ultrasound Carotid Duplex (Doppler) has been completed.   Findings suggest 1-39% internal carotid artery stenosis bilaterally. Vertebral arteries are patent with antegrade flow.  Right lower extremity venous duplex completed. Right lower extremity is negative for deep vein thrombosis. There is no evidence of right Baker's cyst.  07/27/2013 3:23 PM  Maudry Mayhew, RVT, RDCS, RDMS

## 2013-07-27 NOTE — Progress Notes (Signed)
[facility] PROGRESS NOTE  Manuel Armstrong URK:[id number] DOB: [birth year] DOA: 07/26/2013 PCP: No primary provider on file.  Assessment/Plan: CVA; - presents with right side weakness.  MRA/MRI, Carotid Doppler and Echo Pending.  cardiac enzymes negative, ECG A fib., LDL 78, TSH 3.5. UDS negative, UA negative.  Order PT/OT evaluation.  Patient on aspirin. Will start Eliquis as recommend by Dr Leonie Man.  Neurology following.  Anemia - anemia panel pending, hemoccult stool pending. Monitor hb on Eliquis.   Acute renal Insufficiency; IV fluids. B-met pending.   Hypertension - started on betablocker  Atrial fibrillation - TSH normal.   CE negative. Started on eliquis for secondary stroke prevention.   Leg swelling - obtain doppler to RO DVT   Code Status: DNR.  Family Communication: Disposition Plan: Remain in patient. Work up in process.   Consultants:  Neurology  Procedures:  ECHO; Pending.   Carotid Doppler; pending.   Lower extremity doppler; pending  Antibiotics:  none  HPI/Subjective: Feeling well this morning. weakness resolved.   Objective: Filed Vitals:   07/27/13 0600  BP: 168/80  Pulse: 77  Temp: 98 F (36.7 C)  Resp: 18   No intake or output data in the 24 hours ending 07/27/13 0836 Filed Weights   07/27/13 0015 07/27/13 0200  Weight: 68.04 kg (150 lb) 63.73 kg (140 lb 8 oz)    Exam:   General: No distress.   Cardiovascular: S 1, S 2 IRR  Respiratory: CTA  Abdomen: BS present, soft, NT  Musculoskeletal: trace edema.   Neuro non focal.   Data Reviewed: Basic Metabolic Panel:  Recent Labs Lab 07/26/13 2342 07/26/13 2352  NA 135* 137  K 4.3 4.4  CL 95* 97  CO2 27  --   GLUCOSE 106* 109*  BUN 21 24*  CREATININE 1.23 1.50*  CALCIUM 9.2  --    Liver Function Tests:  Recent Labs Lab 07/26/13 2342  AST 22  ALT 16  ALKPHOS 78  BILITOT 0.4  PROT 7.3  ALBUMIN 3.5   No results found for this basename: LIPASE,  AMYLASE,  in the last 168 hours No results found for this basename: AMMONIA,  in the last 168 hours CBC:  Recent Labs Lab 07/26/13 2342 07/26/13 2352  WBC 5.5  --   NEUTROABS 3.6  --   HGB 10.3* 11.6*  HCT 32.7* 34.0*  MCV 84.7  --   PLT 261  --    Cardiac Enzymes:  Recent Labs Lab 07/27/13 0635  TROPONINI <0.30   BNP (last 3 results) No results found for this basename: PROBNP,  in the last 8760 hours CBG: No results found for this basename: GLUCAP,  in the last 168 hours  No results found for this or any previous visit (from the past 240 hour(s)).   Studies: Ct Head Wo Contrast  07/26/2013   CLINICAL DATA:  Code stroke.  Right-sided weakness.  EXAM: CT HEAD WITHOUT CONTRAST  TECHNIQUE: Contiguous axial images were obtained from the base of the skull through the vertex without intravenous contrast.  COMPARISON:  None.  FINDINGS: Skull and Sinuses:No acute osseous findings. Degenerative cystic change within the right mandibular condyle.  Orbits: Bilateral cataract resection.  Brain: No evidence of acute abnormality, such as acute infarction, hemorrhage, hydrocephalus, or mass lesion/mass effect. Chronic small vessel disease with ischemic gliosis confluent around the lateral ventricles. Lacunar insults in the posterior right internal capsule and left putamen. Generalized cerebral volume loss, age appropriate.  These results were called  by telephone at the time of interpretation on 07/26/2013 at 11:42 PM to Dr. Leonel Ramsay, who verbally acknowledged these results.  IMPRESSION: 1. Negative for intracranial hemorrhage or major vessel infarct. 2. Chronic small vessel disease.   Electronically Signed   By: Jorje Guild M.D.   On: 07/26/2013 23:49    Scheduled Meds: . aspirin  300 mg Rectal Daily   Or  . aspirin  325 mg Oral Daily  . metoprolol tartrate  12.5 mg Oral BID   Continuous Infusions: . sodium chloride 75 mL/hr at 07/27/13 1610    Active Problems:   Stroke   Anemia    Hypertension   Atrial fibrillation   CVA (cerebral infarction)   Leg swelling    Time spent: 35 minutes.     [facility] Pager [phone]. If 7PM-7AM, please contact night-coverage at [email or web address], password [facility] 07/27/2013, 8:36 AM  LOS: 1 day

## 2013-07-27 NOTE — ED Notes (Signed)
Dr. Roel Cluck still at the bedside with the patient.

## 2013-07-27 NOTE — Progress Notes (Signed)
Utilization review completed.  

## 2013-07-28 LAB — IRON AND TIBC
Iron: 18 ug/dL — ABNORMAL LOW (ref 42–135)
Saturation Ratios: 6 % — ABNORMAL LOW (ref 20–55)
TIBC: 318 ug/dL (ref 215–435)
UIBC: 300 ug/dL (ref 125–400)

## 2013-07-28 LAB — BASIC METABOLIC PANEL
BUN: 26 mg/dL — ABNORMAL HIGH (ref 6–23)
CHLORIDE: 106 meq/L (ref 96–112)
CO2: 28 mEq/L (ref 19–32)
Calcium: 8.8 mg/dL (ref 8.4–10.5)
Creatinine, Ser: 1.22 mg/dL (ref 0.50–1.35)
GFR calc non Af Amer: 50 mL/min — ABNORMAL LOW (ref >90)
GFR, EST AFRICAN AMERICAN: 57 mL/min — AB (ref >90)
Glucose, Bld: 95 mg/dL (ref 70–99)
Potassium: 5.1 mEq/L (ref 3.7–5.3)
SODIUM: 143 meq/L (ref 137–147)

## 2013-07-28 LAB — CBC
HCT: 31.9 % — ABNORMAL LOW (ref 39.0–52.0)
Hemoglobin: 10.1 g/dL — ABNORMAL LOW (ref 13.0–17.0)
MCH: 26.8 pg (ref 26.0–34.0)
MCHC: 31.7 g/dL (ref 30.0–36.0)
MCV: 84.6 fL (ref 78.0–100.0)
PLATELETS: 254 10*3/uL (ref 150–400)
RBC: 3.77 MIL/uL — ABNORMAL LOW (ref 4.22–5.81)
RDW: 15.5 % (ref 11.5–15.5)
WBC: 5.9 10*3/uL (ref 4.0–10.5)

## 2013-07-28 MED ORDER — METOPROLOL SUCCINATE ER 25 MG PO TB24: 25.0000 mg | ORAL_TABLET | Freq: Every day | ORAL | Status: DC

## 2013-07-28 MED ORDER — STROKE: EARLY STAGES OF RECOVERY BOOK
Freq: Once | Status: DC
  Filled 2013-07-28: qty 1

## 2013-07-28 MED ORDER — APIXABAN 2.5 MG PO TABS: 2.5000 mg | ORAL_TABLET | Freq: Two times a day (BID) | ORAL | Status: DC

## 2013-07-28 MED ORDER — SIMVASTATIN 10 MG PO TABS: 10.0000 mg | ORAL_TABLET | Freq: Every day | ORAL | Status: AC

## 2013-07-28 MED ORDER — SIMVASTATIN 10 MG PO TABS
10.0000 mg | ORAL_TABLET | Freq: Every day | ORAL | Status: DC
  Filled 2013-07-28: qty 1

## 2013-07-28 MED ORDER — SODIUM CHLORIDE 0.9 % IV SOLN
INTRAVENOUS | Status: DC
  Administered 2013-07-28: 07:00:00 via INTRAVENOUS

## 2013-07-28 NOTE — Care Management (Signed)
1041 07-28-13 Eliquis PT COPAY WILL BE $98- NO PRIOR AUTH REQUIRED. 30 day free card given to pt. MD pt will need Rx for 30 day supply no refills and the original Rx with refills.  CM did call [facility] in [place] and medication is available. Ocie Cornfield [place], RN,BSN [phone]

## 2013-07-28 NOTE — Evaluation (Signed)
Physical Therapy Evaluation Patient Details Name: Manuel Armstrong MRN: [id number] DOB: 03/05/1920 Today's Date: 07/28/2013   History of Present Illness  80 yo admitted with Rt side weakness. MRI (+) cortical and subcortical brain at the left frontoparietal junction region  Clinical Impression  Pt presents with mild balance impairment, specifically with quick turns and single limb stance that he has difficulty. However, this is pre-existing for him and does not appear significantly changed. Pt given HEP with balance activities included. Otherwise no acute or f/u PT needs at this time.     Follow Up Recommendations No PT follow up    Equipment Recommendations  None recommended by PT    Recommendations for Other Services       Precautions / Restrictions Precautions Precautions: Fall Precaution Comments: pt has fallen a couple of times [place] not recently Restrictions Weight Bearing Restrictions: No      Mobility  Bed Mobility Overal bed mobility: Modified Independent                Transfers Overall transfer level: Modified independent                  Ambulation/Gait Ambulation/Gait assistance: Supervision Ambulation Distance (Feet): 300 Feet Assistive device: None Gait Pattern/deviations: WFL(Within Functional Limits) Gait velocity: WFL Gait velocity interpretation: at or above normal speed for age/gender General Gait Details: pt ambulates with fast pace and balance is better when pace is faster. He becomes a bit unsteady when he slows down and has to maintain SL stance for short periods. He also has balance deficits with quick turns. He reports that this has been the case for quite some time and he has had some mild vertigo issues.    Stairs Stairs: Yes Stairs assistance: Modified independent (Device/Increase time) Stair Management: One rail Right;Alternating pattern;Forwards Number of Stairs: 4 General stair comments: pt safe on  stairs with rail, also performed step ups 5x with no difficulty and HR 98 bpm.   Wheelchair Mobility    Modified Rankin (Stroke Patients Only) Modified Rankin (Stroke Patients Only) Pre-Morbid Rankin Score: No symptoms Modified Rankin: No significant disability     Balance Overall balance assessment: Needs assistance Sitting-balance support: No upper extremity supported Sitting balance-Leahy Scale: Normal     Standing balance support: No upper extremity supported Standing balance-Leahy Scale: Good Standing balance comment: pt has difficulty with SL stance and requires UE support, more so on the right LE than left. Increased sway with eyes closed but no LOB. Able to achieve and maintain tandem stance without UE support. Pt reports that he has some numbness in fingertips and think it is reasonable that he may have some sensory changes in feet causing decreased joint proprioception. However, this has been the case for quite some times to difficult to discern if any of this is new and/ or specifically related to stroke. He does not seem to show a significant change in balance per him or his family members. Given home balance exercises to perform.                 Standardized Balance Assessment Standardized Balance Assessment : Dynamic Gait Index   Dynamic Gait Index Level Surface: Normal Change in Gait Speed: Mild Impairment Gait and Pivot Turn: Moderate Impairment Step Over Obstacle: Mild Impairment Steps: Mild Impairment       Pertinent Vitals/Pain VSS    Home Living Family/patient expects to be discharged to:: Private residence Living Arrangements: Children  Available Help at Discharge: Family Type of Home: House Home Access: Stairs to enter   Technical brewer of Steps: 2 Home Layout: Two level;Bed/bath upstairs Home Equipment: None      Prior Function Level of Independence: Independent         Comments: pt likes to work in the yard much of the day      Hand Dominance   Dominant Hand: Right    Extremity/Trunk Assessment   Upper Extremity Assessment: Defer to OT evaluation           Lower Extremity Assessment: Overall WFL for tasks assessed;RLE deficits/detail;LLE deficits/detail RLE Deficits / Details: RLE 4+/5 throughout on MMT and equal to left LLE Deficits / Details: LLE 4+/5 throughout on MMT and equal to right  Cervical / Trunk Assessment: Normal  Communication   Communication: No difficulties  Cognition Arousal/Alertness: Awake/alert Behavior During Therapy: WFL for tasks assessed/performed Overall Cognitive Status: Within Functional Limits for tasks assessed                      General Comments      Exercises Other Exercises Other Exercises: given handout with general exercises and incorporation of balance exercises      Assessment/Plan    PT Assessment Patent does not need any further PT services  PT Diagnosis     PT Problem List    PT Treatment Interventions     PT Goals (Current goals can be found in the Care Plan section) Acute Rehab PT Goals Patient Stated Goal: to return to the sun (enjoys outside work) PT Goal Formulation: No goals set, d/c therapy    Frequency     Barriers to discharge        Co-evaluation               End of Session Equipment Utilized During Treatment: Gait belt Activity Tolerance: Patient tolerated treatment well Patient left: in bed;with call bell/phone within reach;with family/visitor present Nurse Communication: Mobility status        Time: 0840-0910 PT Time Calculation (min): 30 min   Charges:   PT Evaluation $Initial PT Evaluation Tier I: 1 Procedure PT Treatments $Gait Training: 8-22 mins $Therapeutic Activity: 8-22 mins   PT G Codes:         Leighton Roach, PT  Acute Rehab Services  [place] 07/28/2013, 10:06 AM

## 2013-07-28 NOTE — Progress Notes (Signed)
Stroke Team Progress Note  HISTORY Manuel Armstrong is a 80 y.o. male with a history of an irregular heart beat who presents with sudden onset right sided weakness at 8:30pm this evening 07/26/2013. The patient states that this has improved mildly since onset. He denies any vision changes(though baseline impatired vision in left eye) or other symptoms. Patient was not administered TPA secondary to mild symptoms, though arrived within window, not within reasonable amount of time to give IV tpa. He was admitted for further evaluation and treatment.  SUBJECTIVE Family present at bedside. Patient up in hall working with PT.  OBJECTIVE Most recent Vital Signs: Filed Vitals:   07/27/13 1607 07/27/13 2000 07/28/13 0000 07/28/13 0400  BP: 160/78 145/66 142/65 158/77  Pulse: 60 63 56 67  Temp: 97.9 F (36.6 C) 98.3 F (36.8 C) 98.2 F (36.8 C) 97.5 F (36.4 C)  TempSrc: Oral Oral Oral Oral  Resp: 17 18 18 18   Height:      Weight:    64.32 kg (141 lb 12.8 oz)  SpO2: 97% 98% 96% 98%   CBG (last 3)  No results found for this basename: GLUCAP,  in the last 72 hours  IV Fluid Intake:   . sodium chloride 75 mL/hr at 07/28/13 0723    MEDICATIONS  .  stroke: mapping our early stages of recovery book   Does not apply Once  . apixaban  2.5 mg Oral BID  . metoprolol tartrate  12.5 mg Oral BID   PRN:  acetaminophen, acetaminophen, senna-docusate  Diet:  Cardiac thin liquids Activity:  OOB with assistance DVT Prophylaxis:  SCDs  CLINICALLY SIGNIFICANT STUDIES Basic Metabolic Panel:   Recent Labs Lab 07/27/13 0920 07/28/13 0535  NA 135* 143  K 4.7 5.1  CL 98 106  CO2 24 28  GLUCOSE 159* 95  BUN 24* 26*  CREATININE 1.17 1.22  CALCIUM 8.8 8.8   Liver Function Tests:   Recent Labs Lab 07/26/13 2342  AST 22  ALT 16  ALKPHOS 78  BILITOT 0.4  PROT 7.3  ALBUMIN 3.5   CBC:   Recent Labs Lab 07/26/13 2342 07/26/13 2352 07/28/13 0535  WBC 5.5  --  5.9  NEUTROABS 3.6  --   --    HGB 10.3* 11.6* 10.1*  HCT 32.7* 34.0* 31.9*  MCV 84.7  --  84.6  PLT 261  --  254   Coagulation:   Recent Labs Lab 07/26/13 2342  LABPROT 13.1  INR 1.01   Cardiac Enzymes:   Recent Labs Lab 07/27/13 0635 07/27/13 0920 07/27/13 1635  TROPONINI <0.30 <0.30 <0.30   Urinalysis:   Recent Labs Lab 07/27/13 0015  COLORURINE YELLOW  LABSPEC 1.009  PHURINE 7.0  GLUCOSEU NEGATIVE  HGBUR NEGATIVE  BILIRUBINUR NEGATIVE  KETONESUR NEGATIVE  PROTEINUR NEGATIVE  UROBILINOGEN 1.0  NITRITE NEGATIVE  LEUKOCYTESUR NEGATIVE   Lipid Panel    Component Value Date/Time   CHOL 125 07/27/2013 0635   TRIG 72 07/27/2013 0635   HDL 33* 07/27/2013 0635   CHOLHDL 3.8 07/27/2013 0635   VLDL 14 07/27/2013 0635   LDLCALC 78 07/27/2013 0635   HgbA1C  Lab Results  Component Value Date   HGBA1C 6.0* 07/27/2013    Urine Drug Screen:     Component Value Date/Time   LABOPIA NONE DETECTED 07/27/2013 0015   COCAINSCRNUR NONE DETECTED 07/27/2013 0015   LABBENZ NONE DETECTED 07/27/2013 0015   AMPHETMU NONE DETECTED 07/27/2013 0015   THCU NONE DETECTED 07/27/2013 0015  LABBARB NONE DETECTED 07/27/2013 0015    Alcohol Level:   Recent Labs Lab 07/26/13 2342  ETH <11    CT of the brain  07/26/2013    1. Negative for intracranial hemorrhage or major vessel infarct. 2. Chronic small vessel disease.  MRI of the brain  07/27/2013    Small area of acute infarction affecting the cortical and subcortical brain at the left frontoparietal junction region. This is consistent with the patient's clinical presentation. No swelling or hemorrhage.  Moderate chronic small vessel ischemic changes elsewhere throughout the brain.    MRA of the brain  07/27/2013   Intracranial MR angiography does not show any large vessel occlusion or correctable proximal stenosis.     2D Echocardiogram  EF 50-55% with no source of embolus.   Carotid Doppler  No evidence of hemodynamically significant internal carotid artery stenosis. Vertebral  artery flow is antegrade.   R LE venous duplex Right lower extremity is negative for deep vein thrombosis. There is no evidence of right Baker's cyst.  CXR  07/27/2013   COPD without acute cardiopulmonary abnormality.     EKG  atrial fibrillation, rate 89. For complete results please see formal report.   Therapy Recommendations   Physical Exam   Pleasant elderly male not in distress.Awake alert. Afebrile. Head is nontraumatic. Neck is supple without bruit. Hearing is normal. Cardiac exam no murmur or gallop. Lungs are clear to auscultation. Distal pulses are well felt. Neurological Exam ;  Awake  Alert oriented x 3. Normal speech and language.eye movements full without nystagmus.fundi were not visualized. Vision acuity and fields appear normal. Hearing is normal. Palatal movements are normal. Face symmetric. Tongue midline. Normal strength, tone, reflexes and coordination. Normal sensation. Gait deferred.  ASSESSMENT Mr. Manuel Armstrong is a 80 y.o. male presenting with right sided weakness. Imaging confirms left frontal cortical and subcortical infarcts. Infarcts felt to be embolic secondary to known atrial fibrillation.  On no antithrombotics prior to admission. Now on eliquis for secondary stroke prevention. Patient with no resultant deficits. Stroke work up completed.Marland Kitchen   atrial fibrillation  Hypertension, new betablocker  LDL 78  Cancer  Creatinine 1.22  Anemia, 10.1  RLE edema, no DVT  Hospital day # 2  TREATMENT/PLAN  Continue  eliquis for secondary stroke prevention.  No driving for now. Can re-evaluate at time of followup.  Monitor hemoglobin  No OP therapies  No further stroke workup indicated.  Patient has a 10-15% risk of having another stroke over the next year, the highest risk is within 2 weeks of the most recent stroke/TIA (risk of having a stroke following a stroke or TIA is the same).  Ongoing risk factor control by Primary Care Physician  Stroke  Service will sign off. Please call should any needs arise.  Follow up with Dr. Leonie Man, [facility], in 2 months.  Burnetta Sabin, MSN, RN, ANVP-BC, AGPCNP-BC [facility] Pager: [phone] 07/28/2013 8:41 AM  I have personally obtained a history, examined the patient, evaluated imaging results, and formulated the assessment and plan of care. I agree with the above.  Jim Like, DO Neurology-Stroke [facility] Pager: [phone]    To contact Stroke Continuity provider, please refer to [email or web address] After hours, contact General Neurology

## 2013-07-28 NOTE — Evaluation (Signed)
Occupational Therapy Evaluation Patient Details Name: Manuel Armstrong MRN: [id number] DOB: 25-Jul-1919 Today's Date: 07/28/2013    History of Present Illness 80 yo admitted with Rt side weakness. MRI (+) cortical and subcortical brain at the left frontoparietal junction region   Clinical Impression   Patient evaluated by Occupational Therapy with no further acute OT needs identified. All education has been completed and the patient has no further questions. See below for any follow-up Occupational Therapy or equipment needs. OT to sign off. Thank you for referral.      Follow Up Recommendations  No OT follow up    Equipment Recommendations  None recommended by OT    Recommendations for Other Services       Precautions / Restrictions Precautions Precautions: Fall      Mobility Bed Mobility Overal bed mobility: Modified Independent                Transfers Overall transfer level: Modified independent                    Balance Overall balance assessment: No apparent balance deficits (not formally assessed) (Pt Vicky to assess with balance test- )                                          ADL Overall ADL's : Modified independent                                       General ADL Comments: Pt educated that no driving until MD clears to drive. pt also educated to reduce time in the sun to the cooler portions of the day ( morning and late afternoon). Pt verbalized understanding and asking if he can lift weights. Pt educated that gradual return to activity over the next couple of weeks is best. Pt and family agreeable     Vision                 Additional Comments: Pt reports inability to see in Inferior right eye quadrants   Perception Perception Perception Tested?: No   Praxis Praxis Praxis tested?: Not tested    Pertinent Vitals/Pain none     Hand Dominance Right   Extremity/Trunk Assessment Upper  Extremity Assessment Upper Extremity Assessment: Overall WFL for tasks assessed Pt reports dropping objects prior to arrival but no deficits with breakfast. Opening all containers and opening all oral care items this AM without deficits. Pt is near baseline   Lower Extremity Assessment Lower Extremity Assessment: Defer to PT evaluation   Cervical / Trunk Assessment Cervical / Trunk Assessment: Normal   Communication Communication Communication: No difficulties   Cognition Arousal/Alertness: Awake/alert Behavior During Therapy: WFL for tasks assessed/performed Overall Cognitive Status: Within Functional Limits for tasks assessed                     General Comments       Exercises       Shoulder Instructions      Home Living Family/patient expects to be discharged to:: Private residence Living Arrangements: Children Available Help at Discharge: Family Type of Home: House Home Access: Stairs to enter Technical brewer of Steps: 2   Home Layout: Two level;Bed/bath upstairs Alternate Level Stairs-Number of Steps: 32   Bathroom Shower/Tub:  Walk-in shower;Door   [employer] Toilet: Handicapped height     Home Equipment: None          Prior Functioning/Environment Level of Independence: Independent             OT Diagnosis:     OT Problem List:     OT Treatment/Interventions:      OT Goals(Current goals can be found in the care plan section) Acute Rehab OT Goals Patient Stated Goal: to return to the sun (enjoys outside work) OT Goal Formulation: With patient/family  OT Frequency:     Barriers to D/C:            Co-evaluation              End of Session Nurse Communication: Mobility status;Precautions  Activity Tolerance: Patient tolerated treatment well Patient left: Other (comment) (with PT vicky)   Time: 9449-6759 OT Time Calculation (min): 22 min Charges:  OT General Charges $OT Visit: 1 Procedure OT Evaluation $Initial OT  Evaluation Tier I: 1 Procedure OT Treatments $Self Care/Home Management : 8-22 mins G-Codes:    Peri Maris 08-27-2013, 8:50 AM Pager: [phone]

## 2013-07-28 NOTE — Discharge Summary (Signed)
Physician Discharge Summary  Patient ID: Manuel Armstrong MRN: [id number] DOB/AGE: 1919-08-26 80 y.o.  Admit date: 07/26/2013 Discharge date: 07/28/2013  Primary Care Physician:  Woody Seller, MD  Discharge Diagnoses:    . acute CVA Stroke . Anemia . Hypertension . Atrial fibrillation . Leg swelling Hyperlipidemia  Consults:  Neurology, Dr. Leonie Man   Recommendations for Outpatient Follow-up:  No driving until cleared by Dr. Leonie Man on the followup appointment  Patient was started on Eliquis   Allergies:  No Known Allergies   Discharge Medications:   Medication List         apixaban 2.5 MG Tabs tablet  Commonly known as:  ELIQUIS  Take 1 tablet (2.5 mg total) by mouth 2 (two) times daily.     MULTIVITAMINS Chew  Chew 1 tablet by mouth daily.     beta carotene w/minerals tablet  Take 2 tablets by mouth daily.     Iron 325 (65 FE) MG Tabs  Take 1 tablet by mouth.     metoprolol succinate 25 MG 24 hr tablet  Commonly known as:  TOPROL-XL  Take 1 tablet (25 mg total) by mouth daily.     simvastatin 10 MG tablet  Commonly known as:  ZOCOR  Take 1 tablet (10 mg total) by mouth at bedtime.         Brief H and P: For complete details please refer to admission H and P, but in brief, patient is a 80 year old male with history of hypertension, atrial fibrillation who was watching TV around 8:30 pm when he noted that his right hand was not working. He went to sleep and when he tried to get up he was very dizzy and his right leg was not working this was at 10:10pm EMS was called and he was brought to ER. Initially code stroke was called but was cancelled as patient was out of the window for tPA and was improving. His leg still feels weak. Denied any slured speech.  Patient was noted to be slightly anemic his family states he has been giving blood regularly but in the past they have prevented him from doing so due to low iron, denies any blood in stool no melena  Hospital  Course:  Patient is a 80 year old male with history of atrial fibrillation, hypertension presented with sudden onset of right-sided weakness on 5/ 4/ 15. Patient was not consider TPA candidate by neurology secondary to mild symptoms. Acute CVA; - presented with right side weakness which has improved. Patient underwent a full stroke workup MRI of the brain showed small area of acute infarction affecting the cortical and subcortical brain at the left frontoparietal junction region, moderate chronic small vessel ischemic changes. MRA showed no large vessel occlusion or stenosis. 2-D echo showed EF of 50-5% with no source of embolus Carotid Dopplers showed no evidence of hemodynamically significant internal carotid artery stenosis. LDL 78, I discussed in detail with Dr. Leonie Man who recommended goal of LDL less than 70 and recommended low-dose statin, patient was started on Zocor 10 mg daily. Given history of atrial fibrillation as possible cause of acute CVA, patient was placed on eliquis for anticoagulation   Acute  renal Insufficiency:  creatinine was 1.5 at the time of admission which improved to 1.2  At discharge.  Hypertension - started on betablocker  Atrial fibrillation - TSH normal, continue Toprol-XL for rate control. CE negative. Started on eliquis for secondary stroke prevention.   Leg swelling - obtain doppler to RO DVT  Day of Discharge BP 158/77  Pulse 67  Temp(Src) 97.5 F (36.4 C) (Oral)  Resp 18  Ht 5\' 11"  (1.803 m)  Wt 64.32 kg (141 lb 12.8 oz)  BMI 19.79 kg/m2  SpO2 98%  Physical Exam: General: Alert and awake oriented x3 not in any acute distress. CVS:  irregularly regular  Chest: clear to auscultation bilaterally, no wheezing rales or rhonchi Abdomen: soft nontender, nondistended, normal bowel sounds Extremities: no cyanosis, clubbing or edema noted bilaterally Neuro: Cranial nerves II-XII intact, no focal neurological deficits   The results of significant  diagnostics from this hospitalization (including imaging, microbiology, ancillary and laboratory) are listed below for reference.    LAB RESULTS: Basic Metabolic Panel:  Recent Labs Lab 07/27/13 0920 07/28/13 0535  NA 135* 143  K 4.7 5.1  CL 98 106  CO2 24 28  GLUCOSE 159* 95  BUN 24* 26*  CREATININE 1.17 1.22  CALCIUM 8.8 8.8   Liver Function Tests:  Recent Labs Lab 07/26/13 2342  AST 22  ALT 16  ALKPHOS 78  BILITOT 0.4  PROT 7.3  ALBUMIN 3.5   No results found for this basename: LIPASE, AMYLASE,  in the last 168 hours No results found for this basename: AMMONIA,  in the last 168 hours CBC:  Recent Labs Lab 07/26/13 2342 07/26/13 2352 07/28/13 0535  WBC 5.5  --  5.9  NEUTROABS 3.6  --   --   HGB 10.3* 11.6* 10.1*  HCT 32.7* 34.0* 31.9*  MCV 84.7  --  84.6  PLT 261  --  254   Cardiac Enzymes:  Recent Labs Lab 07/27/13 0920 07/27/13 1635  TROPONINI <0.30 <0.30   BNP: No components found with this basename: POCBNP,  CBG: No results found for this basename: GLUCAP,  in the last 168 hours  Significant Diagnostic Studies:  Dg Chest 2 View  07/27/2013   CLINICAL DATA:  Stroke  EXAM: CHEST  2 VIEW  COMPARISON:  04/24/2005  FINDINGS: COPD with hyperinflation. Negative for pneumonia. Cardiac enlargement without heart failure or effusion.  IMPRESSION: COPD without acute cardiopulmonary abnormality.   Electronically Signed   By: Franchot Gallo M.D.   On: 07/27/2013 16:02   Ct Head Wo Contrast  07/26/2013   CLINICAL DATA:  Code stroke.  Right-sided weakness.  EXAM: CT HEAD WITHOUT CONTRAST  TECHNIQUE: Contiguous axial images were obtained from the base of the skull through the vertex without intravenous contrast.  COMPARISON:  None.  FINDINGS: Skull and Sinuses:No acute osseous findings. Degenerative cystic change within the right mandibular condyle.  Orbits: Bilateral cataract resection.  Brain: No evidence of acute abnormality, such as acute infarction, hemorrhage,  hydrocephalus, or mass lesion/mass effect. Chronic small vessel disease with ischemic gliosis confluent around the lateral ventricles. Lacunar insults in the posterior right internal capsule and left putamen. Generalized cerebral volume loss, age appropriate.  These results were called by telephone at the time of interpretation on 07/26/2013 at 11:42 PM to Dr. Leonel Ramsay, who verbally acknowledged these results.  IMPRESSION: 1. Negative for intracranial hemorrhage or major vessel infarct. 2. Chronic small vessel disease.   Electronically Signed   By: Jorje Guild M.D.   On: 07/26/2013 23:49   Mr Brain Wo Contrast  07/27/2013   CLINICAL DATA:  Dizziness.  Right hand weakness.  EXAM: MRI HEAD WITHOUT CONTRAST  MRA HEAD WITHOUT CONTRAST  TECHNIQUE: Multiplanar, multiecho pulse sequences of the brain and surrounding structures were obtained without intravenous contrast. Angiographic images of the head  were obtained using MRA technique without contrast.  COMPARISON:  Head CT 07/26/2013  FINDINGS: MRI HEAD FINDINGS  There is a small acute infarction at the cortical and subcortical brain at the left frontoparietal junction region. No evidence of hemorrhage or swelling.  There chronic small-vessel changes of the pons. No focal cerebellar insult. The cerebral hemispheres show old lacunar infarctions in the basal ganglia and moderate chronic small vessel changes throughout the deep and subcortical white matter. No large vessel territory infarction. No mass lesion, hemorrhage, hydrocephalus or extra-axial collection. No pituitary mass. No inflammatory sinus disease. No skull or skullbase lesion.  MRA HEAD FINDINGS  Both internal carotid arteries are widely patent into the brain. The anterior and middle cerebral vessels are patent without proximal stenosis, aneurysm or vascular malformation. No detectable missing branches.  Both vertebral arteries are patent. The basilar artery is patent and smooth. Posterior circulation  branch vessels are normal, with the posterior cerebral arteries receiving most of there supply from the anterior circulation.  IMPRESSION: Small area of acute infarction affecting the cortical and subcortical brain at the left frontoparietal junction region. This is consistent with the patient's clinical presentation. No swelling or hemorrhage.  Moderate chronic small vessel ischemic changes elsewhere throughout the brain.  Intracranial MR angiography does not show any large vessel occlusion or correctable proximal stenosis.   Electronically Signed   By: Nelson Chimes M.D.   On: 07/27/2013 12:50   Mr Jodene Nam Head/brain Wo Cm  07/27/2013   CLINICAL DATA:  Dizziness.  Right hand weakness.  EXAM: MRI HEAD WITHOUT CONTRAST  MRA HEAD WITHOUT CONTRAST  TECHNIQUE: Multiplanar, multiecho pulse sequences of the brain and surrounding structures were obtained without intravenous contrast. Angiographic images of the head were obtained using MRA technique without contrast.  COMPARISON:  Head CT 07/26/2013  FINDINGS: MRI HEAD FINDINGS  There is a small acute infarction at the cortical and subcortical brain at the left frontoparietal junction region. No evidence of hemorrhage or swelling.  There chronic small-vessel changes of the pons. No focal cerebellar insult. The cerebral hemispheres show old lacunar infarctions in the basal ganglia and moderate chronic small vessel changes throughout the deep and subcortical white matter. No large vessel territory infarction. No mass lesion, hemorrhage, hydrocephalus or extra-axial collection. No pituitary mass. No inflammatory sinus disease. No skull or skullbase lesion.  MRA HEAD FINDINGS  Both internal carotid arteries are widely patent into the brain. The anterior and middle cerebral vessels are patent without proximal stenosis, aneurysm or vascular malformation. No detectable missing branches.  Both vertebral arteries are patent. The basilar artery is patent and smooth. Posterior  circulation branch vessels are normal, with the posterior cerebral arteries receiving most of there supply from the anterior circulation.  IMPRESSION: Small area of acute infarction affecting the cortical and subcortical brain at the left frontoparietal junction region. This is consistent with the patient's clinical presentation. No swelling or hemorrhage.  Moderate chronic small vessel ischemic changes elsewhere throughout the brain.  Intracranial MR angiography does not show any large vessel occlusion or correctable proximal stenosis.   Electronically Signed   By: Nelson Chimes M.D.   On: 07/27/2013 12:50    2D ECHO: Study Conclusions  - Left ventricle: The cavity size was normal. Wall thickness was increased in a pattern of mild LVH. Systolic function was normal. The estimated ejection fraction was in the range of 50% to 55%. Wall motion was normal; there were no regional wall motion abnormalities. - Aortic valve: Mild regurgitation. -  Mitral valve: Mild regurgitation. - Right ventricle: The cavity size was mildly dilated. - Right atrium: The atrium was moderately dilated. - Pulmonary arteries: PA peak pressure: 24mm Hg (S).    Disposition and Follow-up:     Discharge Orders   Future Appointments Provider Department Dept Phone   08/26/2013 7:30 AM Hayden Pedro, MD [place] [phone]   Future Orders Complete By Expires   Diet - low sodium heart healthy  As directed    Discharge instructions  As directed    Increase activity slowly  As directed        DISPOSITION: home DIET:  heart healthy diet    DISCHARGE FOLLOW-UP Follow-up Information   Follow up with Forbes Cellar, MD. Schedule an appointment as soon as possible for a visit in 2 months. (Stroke Clinic)    Specialties:  Neurology, Radiology   Contact information:   [address] [place] [address] [phone]       Follow up with Woody Seller, MD. Schedule  an appointment as soon as possible for a visit in 2 weeks. (for hospital follow-up)    Specialty:  Family Medicine   Contact information:   [address] [place] [address] [phone]       Time spent on Discharge: 40 minutes   Signed:   Ripudeep Krystal Eaton M.D. [facility] 07/28/2013, 9:48 AM Pager: [phone]   **Disclaimer: This note was dictated with voice recognition software. Similar sounding words can inadvertently be transcribed and this note may contain transcription errors which may not have been corrected upon publication of note.**

## 2013-07-29 ENCOUNTER — Telehealth: Payer: Self-pay | Admitting: *Deleted

## 2013-07-29 ENCOUNTER — Encounter (INDEPENDENT_AMBULATORY_CARE_PROVIDER_SITE_OTHER): Payer: Medicare Other | Admitting: Ophthalmology

## 2013-07-29 NOTE — Telephone Encounter (Signed)
I called and LMVM for daughter in law that riding mower ok.  To call back if needed.

## 2013-07-29 NOTE — Telephone Encounter (Signed)
TC to daughter in law.  Asking about driving.  Per notes no driving until seen in the office with Dr. Leonie Man.  Riding mower?   Stroke deficits none. (back to baseline).  Reiterated no driving until seen.  [employer] is not made aware by [place].

## 2013-07-29 NOTE — Telephone Encounter (Signed)
Patient's spouse has question regarding driving.  Please call and advise. Thanks

## 2013-07-29 NOTE — Telephone Encounter (Signed)
A riding lawn mower is fine. Just no car until he sees Dr Leonie Man. Thanks

## 2013-08-26 ENCOUNTER — Encounter (INDEPENDENT_AMBULATORY_CARE_PROVIDER_SITE_OTHER): Payer: Medicare Other | Admitting: Ophthalmology

## 2013-08-26 DIAGNOSIS — H353290 Exudative age-related macular degeneration, unspecified eye, stage unspecified: Secondary | ICD-10-CM

## 2013-08-26 DIAGNOSIS — H43819 Vitreous degeneration, unspecified eye: Secondary | ICD-10-CM

## 2013-08-26 DIAGNOSIS — H3530 Unspecified macular degeneration: Secondary | ICD-10-CM

## 2013-08-31 ENCOUNTER — Encounter: Payer: Self-pay | Admitting: Neurology

## 2013-10-07 ENCOUNTER — Encounter: Payer: Self-pay | Admitting: Neurology

## 2013-10-07 ENCOUNTER — Ambulatory Visit: Payer: Self-pay | Admitting: Neurology

## 2013-10-07 ENCOUNTER — Ambulatory Visit (INDEPENDENT_AMBULATORY_CARE_PROVIDER_SITE_OTHER): Payer: Medicare Other | Admitting: Neurology

## 2013-10-07 VITALS — BP 157/85 | HR 61 | Ht 69.0 in | Wt 141.8 lb

## 2013-10-07 DIAGNOSIS — I639 Cerebral infarction, unspecified: Secondary | ICD-10-CM

## 2013-10-07 DIAGNOSIS — I4891 Unspecified atrial fibrillation: Secondary | ICD-10-CM

## 2013-10-07 DIAGNOSIS — I480 Paroxysmal atrial fibrillation: Secondary | ICD-10-CM

## 2013-10-07 DIAGNOSIS — I6350 Cerebral infarction due to unspecified occlusion or stenosis of unspecified cerebral artery: Secondary | ICD-10-CM

## 2013-10-07 MED ORDER — APIXABAN 5 MG PO TABS: 5.0000 mg | ORAL_TABLET | Freq: Two times a day (BID) | ORAL | Status: DC

## 2013-10-07 NOTE — Progress Notes (Signed)
STROKE NEUROLOGY FOLLOW UP NOTE  NAME: OSHUA MCCONAHA DOB: October 06, 1919  REASON FOR VISIT: stroke follow up HISTORY FROM: pt and his daughter in law  Today we had the pleasure of seeing CHAITANYA AMEDEE in follow-up at our [facility]. Pt was accompanied by daughter in law.   History Summary 80 yo white male with PMH of Afib not on any anticoagulation was admitted in 07/2013 due to sudden onset right side weakness. No tPA given due to mild symptoms. Symptoms resolved quickly. MRI showed small cortical / subcortical infarct at left MCA territory. He was discharged with eliquis 2.5mg  bid as well as zocor. Also metoprolol for rate control.   Interval History During the interval time, the patient has been doing well. No neurological symptoms or recurrent stroke. He followed up with his PCP and treated with iron for anemia. His Hb improved from 10 during admission to 11.8 last check. However, he did not tolerate iron pill bid dosing and currently stopped. His Cre on admission was 1.55 but now around 1.0, much improved too. His weight today is 141.8 lb.     REVIEW OF SYSTEMS: Full 14 system review of systems performed and notable only for those listed below and in HPI above, all others are negative:  Constitutional: N/A  Cardiovascular: N/A  Ear/Nose/Throat: hearing loss  Skin: N/A  Eyes: N/A  Respiratory: N/A  Gastroitestinal: N/A  Hematology/Lymphatic: anemia, easy bruising  Endocrine: N/A  Musculoskeletal: joint pain, cramps  Allergy/Immunology: allergies Neurological: N/A  Psychiatric: N/A  The following represents the patient's updated allergies and side effects list: No Known Allergies  Labs since last visit of relevance include the following: Results for orders placed during the hospital encounter of 07/26/13  ETHANOL      Result Value Ref Range   Alcohol, Ethyl (B) <11  0 - 11 mg/dL  PROTIME-INR      Result Value Ref Range   Prothrombin Time 13.1  11.6 - 15.2 seconds     INR 1.01  0.00 - 1.49  APTT      Result Value Ref Range   aPTT 33  24 - 37 seconds  CBC      Result Value Ref Range   WBC 5.5  4.0 - 10.5 K/uL   RBC 3.86 (*) 4.22 - 5.81 MIL/uL   Hemoglobin 10.3 (*) 13.0 - 17.0 g/dL   HCT 32.7 (*) 39.0 - 52.0 %   MCV 84.7  78.0 - 100.0 fL   MCH 26.7  26.0 - 34.0 pg   MCHC 31.5  30.0 - 36.0 g/dL   RDW 15.2  11.5 - 15.5 %   Platelets 261  150 - 400 K/uL  DIFFERENTIAL      Result Value Ref Range   Neutrophils Relative % 66  43 - 77 %   Neutro Abs 3.6  1.7 - 7.7 K/uL   Lymphocytes Relative 20  12 - 46 %   Lymphs Abs 1.1  0.7 - 4.0 K/uL   Monocytes Relative 11  3 - 12 %   Monocytes Absolute 0.6  0.1 - 1.0 K/uL   Eosinophils Relative 2  0 - 5 %   Eosinophils Absolute 0.1  0.0 - 0.7 K/uL   Basophils Relative 1  0 - 1 %   Basophils Absolute 0.1  0.0 - 0.1 K/uL  COMPREHENSIVE METABOLIC PANEL      Result Value Ref Range   Sodium 135 (*) 137 - 147 mEq/L   Potassium 4.3  3.7 - 5.3 mEq/L   Chloride 95 (*) 96 - 112 mEq/L   CO2 27  19 - 32 mEq/L   Glucose, Bld 106 (*) 70 - 99 mg/dL   BUN 21  6 - 23 mg/dL   Creatinine, Ser 1.23  0.50 - 1.35 mg/dL   Calcium 9.2  8.4 - 10.5 mg/dL   Total Protein 7.3  6.0 - 8.3 g/dL   Albumin 3.5  3.5 - 5.2 g/dL   AST 22  0 - 37 U/L   ALT 16  0 - 53 U/L   Alkaline Phosphatase 78  39 - 117 U/L   Total Bilirubin 0.4  0.3 - 1.2 mg/dL   GFR calc non Af Amer 49 (*) >90 mL/min   GFR calc Af Amer 57 (*) >90 mL/min  URINE RAPID DRUG SCREEN (HOSP PERFORMED)      Result Value Ref Range   Opiates NONE DETECTED  NONE DETECTED   Cocaine NONE DETECTED  NONE DETECTED   Benzodiazepines NONE DETECTED  NONE DETECTED   Amphetamines NONE DETECTED  NONE DETECTED   Tetrahydrocannabinol NONE DETECTED  NONE DETECTED   Barbiturates NONE DETECTED  NONE DETECTED  URINALYSIS, ROUTINE W REFLEX MICROSCOPIC      Result Value Ref Range   Color, Urine YELLOW  YELLOW   APPearance CLEAR  CLEAR   Specific Gravity, Urine 1.009  1.005 - 1.030    pH 7.0  5.0 - 8.0   Glucose, UA NEGATIVE  NEGATIVE mg/dL   Hgb urine dipstick NEGATIVE  NEGATIVE   Bilirubin Urine NEGATIVE  NEGATIVE   Ketones, ur NEGATIVE  NEGATIVE mg/dL   Protein, ur NEGATIVE  NEGATIVE mg/dL   Urobilinogen, UA 1.0  0.0 - 1.0 mg/dL   Nitrite NEGATIVE  NEGATIVE   Leukocytes, UA NEGATIVE  NEGATIVE  HEMOGLOBIN A1C      Result Value Ref Range   Hemoglobin A1C 6.0 (*) <5.7 %   Mean Plasma Glucose 126 (*) <117 mg/dL  LIPID PANEL      Result Value Ref Range   Cholesterol 125  0 - 200 mg/dL   Triglycerides 72  <150 mg/dL   HDL 33 (*) >39 mg/dL   Total CHOL/HDL Ratio 3.8     VLDL 14  0 - 40 mg/dL   LDL Cholesterol 78  0 - 99 mg/dL  VITAMIN B12      Result Value Ref Range   Vitamin B-12 438  211 - 911 pg/mL  FOLATE      Result Value Ref Range   Folate >20.0    IRON AND TIBC      Result Value Ref Range   Iron 18 (*) 42 - 135 ug/dL   TIBC 318  215 - 435 ug/dL   Saturation Ratios 6 (*) 20 - 55 %   UIBC 300  125 - 400 ug/dL  FERRITIN      Result Value Ref Range   Ferritin 60  22 - 322 ng/mL  RETICULOCYTES      Result Value Ref Range   Retic Ct Pct 2.1  0.4 - 3.1 %   RBC. 3.79 (*) 4.22 - 5.81 MIL/uL   Retic Count, Manual 79.6  19.0 - 186.0 K/uL  TROPONIN I      Result Value Ref Range   Troponin I <0.30  <0.30 ng/mL  TROPONIN I      Result Value Ref Range   Troponin I <0.30  <0.30 ng/mL  TROPONIN I  Result Value Ref Range   Troponin I <0.30  <0.30 ng/mL  TSH      Result Value Ref Range   TSH 3.580  0.350 - 4.500 uIU/mL  BASIC METABOLIC PANEL      Result Value Ref Range   Sodium 135 (*) 137 - 147 mEq/L   Potassium 4.7  3.7 - 5.3 mEq/L   Chloride 98  96 - 112 mEq/L   CO2 24  19 - 32 mEq/L   Glucose, Bld 159 (*) 70 - 99 mg/dL   BUN 24 (*) 6 - 23 mg/dL   Creatinine, Ser 1.17  0.50 - 1.35 mg/dL   Calcium 8.8  8.4 - 10.5 mg/dL   GFR calc non Af Amer 52 (*) >90 mL/min   GFR calc Af Amer 60 (*) >90 mL/min  CBC      Result Value Ref Range   WBC 5.9   4.0 - 10.5 K/uL   RBC 3.77 (*) 4.22 - 5.81 MIL/uL   Hemoglobin 10.1 (*) 13.0 - 17.0 g/dL   HCT 31.9 (*) 39.0 - 52.0 %   MCV 84.6  78.0 - 100.0 fL   MCH 26.8  26.0 - 34.0 pg   MCHC 31.7  30.0 - 36.0 g/dL   RDW 15.5  11.5 - 15.5 %   Platelets 254  150 - 400 K/uL  BASIC METABOLIC PANEL      Result Value Ref Range   Sodium 143  137 - 147 mEq/L   Potassium 5.1  3.7 - 5.3 mEq/L   Chloride 106  96 - 112 mEq/L   CO2 28  19 - 32 mEq/L   Glucose, Bld 95  70 - 99 mg/dL   BUN 26 (*) 6 - 23 mg/dL   Creatinine, Ser 1.22  0.50 - 1.35 mg/dL   Calcium 8.8  8.4 - 10.5 mg/dL   GFR calc non Af Amer 50 (*) >90 mL/min   GFR calc Af Amer 57 (*) >90 mL/min  I-STAT CHEM 8, ED      Result Value Ref Range   Sodium 137  137 - 147 mEq/L   Potassium 4.4  3.7 - 5.3 mEq/L   Chloride 97  96 - 112 mEq/L   BUN 24 (*) 6 - 23 mg/dL   Creatinine, Ser 1.50 (*) 0.50 - 1.35 mg/dL   Glucose, Bld 109 (*) 70 - 99 mg/dL   Calcium, Ion 1.17  1.13 - 1.30 mmol/L   TCO2 28  0 - 100 mmol/L   Hemoglobin 11.6 (*) 13.0 - 17.0 g/dL   HCT 34.0 (*) 39.0 - 52.0 %  I-STAT TROPOININ, ED      Result Value Ref Range   Troponin i, poc 0.00  0.00 - 0.08 ng/mL   Comment 3             The neurologically relevant items on the patient's problem list were reviewed on today's visit.  Neurologic Examination  A problem focused neurological exam (12 or more points of the single system neurologic examination, vital signs counts as 1 point, cranial nerves count for 8 points) was performed.  Blood pressure 157/85, pulse 61, height 5\' 9"  (1.753 m), weight 141 lb 12.8 oz (64.32 kg).  General - Well nourished, well developed, in no apparent distress.  Ophthalmologic - Sharp disc margins OU.  Cardiovascular - Regular rate and rhythm with no murmur.  Mental Status -  Level of arousal and orientation to time, place, and person were intact. Language including  expression, naming, repetition, comprehension, reading, and writing was assessed and  found intact. Attention span and concentration were normal.  Cranial Nerves II - XII - II - Vision intact OU. III, IV, VI - Extraocular movements intact. V - Facial sensation intact bilaterally. VII - Facial movement intact bilaterally. VIII - hard of Hearing & vestibular intact bilaterally. X - Palate elevates symmetrically. XI - Chin turning & shoulder shrug intact bilaterally. XII - Tongue protrusion intact.  Motor Strength - The patient's strength was normal in all extremities and pronator drift was absent.  Bulk was normal and fasciculations were absent.   Motor Tone - Muscle tone was assessed at the neck and appendages and was normal.  Reflexes - The patient's reflexes were normal in all extremities and he had no pathological reflexes.  Sensory - Light touch, temperature/pinprick, vibration and proprioception, and Romberg testing were assessed and were normal.    Coordination - The patient had normal movements in the hands and feet with no ataxia or dysmetria.  Tremor was absent.  Gait and Station - The patient's transfers, posture, gait, station, and turns were observed as normal.  Data reviewed: I personally reviewed the images and agree with the radiology interpretations.  2D echo -  - Left ventricle: The cavity size was normal. Wall thickness   was increased in a pattern of mild LVH. Systolic function   was normal. The estimated ejection fraction was in the   range of 50% to 55%. Wall motion was normal; there were no   regional wall motion abnormalities. - Aortic valve: Mild regurgitation. - Mitral valve: Mild regurgitation. - Right ventricle: The cavity size was mildly dilated. - Right atrium: The atrium was moderately dilated. - Pulmonary arteries: PA peak pressure: 97mm Hg (S).  Carotid Doppler -  Findings suggest 1-39% internal carotid artery stenosis bilaterally. Vertebral arteries are patent with antegrade flow.  MRI and MRA -  Small area of acute infarction  affecting the cortical and subcortical brain at the left frontoparietal junction region. This is consistent with the patient's clinical presentation. No swelling or hemorrhage. Moderate chronic small vessel ischemic changes elsewhere throughout the brain. Intracranial MR angiography does not show any large vessel occlusion or correctable proximal stenosis.  Component     Latest Ref Rng 07/27/2013  Cholesterol     0 - 200 mg/dL 125  Triglycerides     <150 mg/dL 72  HDL     >39 mg/dL 33 (L)  Total CHOL/HDL Ratio      3.8  VLDL     0 - 40 mg/dL 14  LDL (calc)     0 - 99 mg/dL 78  Iron     42 - 135 ug/dL 18 (L)  TIBC     215 - 435 ug/dL 318  Saturation Ratios     20 - 55 % 6 (L)  UIBC     125 - 400 ug/dL 300  Retic Ct Pct     0.4 - 3.1 % 2.1  RBC.     4.22 - 5.81 MIL/uL 3.79 (L)  Retic Count, Manual     19.0 - 186.0 K/uL 79.6  Hemoglobin A1C     <5.7 % 6.0 (H)  Mean Plasma Glucose     <117 mg/dL 126 (H)  Vitamin B-12     211 - 911 pg/mL 438  Folate      >20.0  Ferritin     22 - 322 ng/mL 60  TSH  0.350 - 4.500 uIU/mL 3.580    Assessment: As you may recall, he is a 80 y.o. Caucasian male with a diagnosis of left MCA small stroke. Stroke work up not remarkable and his stroke is likely due to afib. He was not on any anticoagulation prior to the event. Currently he is on eliquis. However, he was on 2.5mg  bid but his Cre is much better now, his weight is more than 60kg so he should be using 5mg  bid. Will increase dose today. Strict BP control. He also has mild anemia and currently much improved after iron treatment. Will watch for any evidence of stroke and recommend resume iron pills.  Plan:  - increase eliquils to 5mg  bid, the regular dose as per company dosing chart - continue statin - strict BP control to avoid bleeding while on eliquis - resume iron treatment for anemia - PCP to follow up stroke risk factor modification - RTC in 2 months.  Diagnoses from this  visit: Paroxysmal atrial fibrillation - Plan: apixaban (ELIQUIS) 5 MG TABS tablet  Stroke - Plan: apixaban (ELIQUIS) 5 MG TABS tablet  No orders of the defined types were placed in this encounter.   Meds ordered this encounter  Medications  . apixaban (ELIQUIS) 5 MG TABS tablet    Sig: Take 1 tablet (5 mg total) by mouth 2 (two) times daily.    Dispense:  60 tablet    Refill:  5   Patient Instructions  1. Increase eliquis from 2.5mg  twice a day to 5mg  twice a day 2. Can not missing doses 3. Watch for any evidence of bleeding, if there is active bleeding, give [place] a call or call 911  4. Control BP to avoid bleeding risk 5. Continue zocor 6. Follow up with PCP for blood check regularly 7. Recommend resume iron pills. 8. Follow up in 2 months.   Rosalin Hawking, MD PhD [facility] [address] [place], [place] [address] [phone]

## 2013-10-07 NOTE — Patient Instructions (Signed)
1. Increase eliquis from 2.5mg  twice a day to 5mg  twice a day 2. Can not missing doses 3. Watch for any evidence of bleeding, if there is active bleeding, give [place] a call or call 911  4. Control BP to avoid bleeding risk 5. Continue zocor 6. Follow up with PCP for blood check regularly 7. Recommend resume iron pills. 8. Follow up in 2 months.

## 2013-12-09 ENCOUNTER — Encounter (INDEPENDENT_AMBULATORY_CARE_PROVIDER_SITE_OTHER): Payer: Medicare Other | Admitting: Ophthalmology

## 2013-12-09 DIAGNOSIS — H353290 Exudative age-related macular degeneration, unspecified eye, stage unspecified: Secondary | ICD-10-CM

## 2013-12-09 DIAGNOSIS — H3530 Unspecified macular degeneration: Secondary | ICD-10-CM

## 2013-12-09 DIAGNOSIS — H43819 Vitreous degeneration, unspecified eye: Secondary | ICD-10-CM

## 2013-12-13 ENCOUNTER — Ambulatory Visit (INDEPENDENT_AMBULATORY_CARE_PROVIDER_SITE_OTHER): Payer: Medicare Other | Admitting: Neurology

## 2013-12-13 ENCOUNTER — Encounter: Payer: Self-pay | Admitting: Neurology

## 2013-12-13 VITALS — BP 167/96 | HR 61 | Wt 140.8 lb

## 2013-12-13 DIAGNOSIS — I639 Cerebral infarction, unspecified: Secondary | ICD-10-CM

## 2013-12-13 DIAGNOSIS — I480 Paroxysmal atrial fibrillation: Secondary | ICD-10-CM

## 2013-12-13 DIAGNOSIS — I6350 Cerebral infarction due to unspecified occlusion or stenosis of unspecified cerebral artery: Secondary | ICD-10-CM

## 2013-12-13 DIAGNOSIS — I4891 Unspecified atrial fibrillation: Secondary | ICD-10-CM

## 2013-12-13 NOTE — Progress Notes (Signed)
STROKE NEUROLOGY FOLLOW UP NOTE  NAME: Manuel Armstrong DOB: 1919/04/05  REASON FOR VISIT: stroke follow up HISTORY FROM: pt and his daughter in law  Today we had the pleasure of seeing Manuel Armstrong in follow-up at our [facility]. Pt was accompanied by daughter in law.   History Summary 80 yo white male with PMH of Afib not on any anticoagulation was admitted in 07/2013 due to sudden onset right side weakness. No tPA given due to mild symptoms. Symptoms resolved quickly. MRI showed small cortical / subcortical infarct at left MCA territory. He was discharged with eliquis 2.5mg  bid as well as zocor. Also metoprolol for rate control.   10/07/13 follow up - pt has been doing well. His Cre improved so his eliquis increased to 5mg  bid. Resumed his iron pill once a day for anemia.   Interval History During the interval time, the patient has been doing well. BP controlled well, SBP around 130-145, sometimes 117. Continued on eliquis 5mg  bid without problem. No missing doses. Had appointment with PCP on 12/27/13.   REVIEW OF SYSTEMS: Full 14 system review of systems performed and notable only for those listed below and in HPI above, all others are negative:  Constitutional: N/A  Cardiovascular: N/A  Ear/Nose/Throat: hearing loss  Skin: N/A  Eyes: N/A  Respiratory: N/A  Gastroitestinal: N/A  Hematology/Lymphatic: anemia, easy bruising  Endocrine: N/A  Musculoskeletal: joint pain, cramps  Allergy/Immunology: allergies Neurological: N/A  Psychiatric: N/A  The following represents the patient's updated allergies and side effects list: No Known Allergies  Labs since last visit of relevance include the following: Results for orders placed during the hospital encounter of 07/26/13  ETHANOL      Result Value Ref Range   Alcohol, Ethyl (B) <11  0 - 11 mg/dL  PROTIME-INR      Result Value Ref Range   Prothrombin Time 13.1  11.6 - 15.2 seconds   INR 1.01  0.00 - 1.49  APTT   Result Value Ref Range   aPTT 33  24 - 37 seconds  CBC      Result Value Ref Range   WBC 5.5  4.0 - 10.5 K/uL   RBC 3.86 (*) 4.22 - 5.81 MIL/uL   Hemoglobin 10.3 (*) 13.0 - 17.0 g/dL   HCT 32.7 (*) 39.0 - 52.0 %   MCV 84.7  78.0 - 100.0 fL   MCH 26.7  26.0 - 34.0 pg   MCHC 31.5  30.0 - 36.0 g/dL   RDW 15.2  11.5 - 15.5 %   Platelets 261  150 - 400 K/uL  DIFFERENTIAL      Result Value Ref Range   Neutrophils Relative % 66  43 - 77 %   Neutro Abs 3.6  1.7 - 7.7 K/uL   Lymphocytes Relative 20  12 - 46 %   Lymphs Abs 1.1  0.7 - 4.0 K/uL   Monocytes Relative 11  3 - 12 %   Monocytes Absolute 0.6  0.1 - 1.0 K/uL   Eosinophils Relative 2  0 - 5 %   Eosinophils Absolute 0.1  0.0 - 0.7 K/uL   Basophils Relative 1  0 - 1 %   Basophils Absolute 0.1  0.0 - 0.1 K/uL  COMPREHENSIVE METABOLIC PANEL      Result Value Ref Range   Sodium 135 (*) 137 - 147 mEq/L   Potassium 4.3  3.7 - 5.3 mEq/L   Chloride 95 (*) 96 - 112  mEq/L   CO2 27  19 - 32 mEq/L   Glucose, Bld 106 (*) 70 - 99 mg/dL   BUN 21  6 - 23 mg/dL   Creatinine, Ser 1.23  0.50 - 1.35 mg/dL   Calcium 9.2  8.4 - 10.5 mg/dL   Total Protein 7.3  6.0 - 8.3 g/dL   Albumin 3.5  3.5 - 5.2 g/dL   AST 22  0 - 37 U/L   ALT 16  0 - 53 U/L   Alkaline Phosphatase 78  39 - 117 U/L   Total Bilirubin 0.4  0.3 - 1.2 mg/dL   GFR calc non Af Amer 49 (*) >90 mL/min   GFR calc Af Amer 57 (*) >90 mL/min  URINE RAPID DRUG SCREEN (HOSP PERFORMED)      Result Value Ref Range   Opiates NONE DETECTED  NONE DETECTED   Cocaine NONE DETECTED  NONE DETECTED   Benzodiazepines NONE DETECTED  NONE DETECTED   Amphetamines NONE DETECTED  NONE DETECTED   Tetrahydrocannabinol NONE DETECTED  NONE DETECTED   Barbiturates NONE DETECTED  NONE DETECTED  URINALYSIS, ROUTINE W REFLEX MICROSCOPIC      Result Value Ref Range   Color, Urine YELLOW  YELLOW   APPearance CLEAR  CLEAR   Specific Gravity, Urine 1.009  1.005 - 1.030   pH 7.0  5.0 - 8.0   Glucose, UA  NEGATIVE  NEGATIVE mg/dL   Hgb urine dipstick NEGATIVE  NEGATIVE   Bilirubin Urine NEGATIVE  NEGATIVE   Ketones, ur NEGATIVE  NEGATIVE mg/dL   Protein, ur NEGATIVE  NEGATIVE mg/dL   Urobilinogen, UA 1.0  0.0 - 1.0 mg/dL   Nitrite NEGATIVE  NEGATIVE   Leukocytes, UA NEGATIVE  NEGATIVE  HEMOGLOBIN A1C      Result Value Ref Range   Hemoglobin A1C 6.0 (*) <5.7 %   Mean Plasma Glucose 126 (*) <117 mg/dL  LIPID PANEL      Result Value Ref Range   Cholesterol 125  0 - 200 mg/dL   Triglycerides 72  <150 mg/dL   HDL 33 (*) >39 mg/dL   Total CHOL/HDL Ratio 3.8     VLDL 14  0 - 40 mg/dL   LDL Cholesterol 78  0 - 99 mg/dL  VITAMIN B12      Result Value Ref Range   Vitamin B-12 438  211 - 911 pg/mL  FOLATE      Result Value Ref Range   Folate >20.0    IRON AND TIBC      Result Value Ref Range   Iron 18 (*) 42 - 135 ug/dL   TIBC 318  215 - 435 ug/dL   Saturation Ratios 6 (*) 20 - 55 %   UIBC 300  125 - 400 ug/dL  FERRITIN      Result Value Ref Range   Ferritin 60  22 - 322 ng/mL  RETICULOCYTES      Result Value Ref Range   Retic Ct Pct 2.1  0.4 - 3.1 %   RBC. 3.79 (*) 4.22 - 5.81 MIL/uL   Retic Count, Manual 79.6  19.0 - 186.0 K/uL  TROPONIN I      Result Value Ref Range   Troponin I <0.30  <0.30 ng/mL  TROPONIN I      Result Value Ref Range   Troponin I <0.30  <0.30 ng/mL  TROPONIN I      Result Value Ref Range   Troponin I <0.30  <0.30  ng/mL  TSH      Result Value Ref Range   TSH 3.580  0.350 - 4.500 uIU/mL  BASIC METABOLIC PANEL      Result Value Ref Range   Sodium 135 (*) 137 - 147 mEq/L   Potassium 4.7  3.7 - 5.3 mEq/L   Chloride 98  96 - 112 mEq/L   CO2 24  19 - 32 mEq/L   Glucose, Bld 159 (*) 70 - 99 mg/dL   BUN 24 (*) 6 - 23 mg/dL   Creatinine, Ser 1.17  0.50 - 1.35 mg/dL   Calcium 8.8  8.4 - 10.5 mg/dL   GFR calc non Af Amer 52 (*) >90 mL/min   GFR calc Af Amer 60 (*) >90 mL/min  CBC      Result Value Ref Range   WBC 5.9  4.0 - 10.5 K/uL   RBC 3.77 (*)  4.22 - 5.81 MIL/uL   Hemoglobin 10.1 (*) 13.0 - 17.0 g/dL   HCT 31.9 (*) 39.0 - 52.0 %   MCV 84.6  78.0 - 100.0 fL   MCH 26.8  26.0 - 34.0 pg   MCHC 31.7  30.0 - 36.0 g/dL   RDW 15.5  11.5 - 15.5 %   Platelets 254  150 - 400 K/uL  BASIC METABOLIC PANEL      Result Value Ref Range   Sodium 143  137 - 147 mEq/L   Potassium 5.1  3.7 - 5.3 mEq/L   Chloride 106  96 - 112 mEq/L   CO2 28  19 - 32 mEq/L   Glucose, Bld 95  70 - 99 mg/dL   BUN 26 (*) 6 - 23 mg/dL   Creatinine, Ser 1.22  0.50 - 1.35 mg/dL   Calcium 8.8  8.4 - 10.5 mg/dL   GFR calc non Af Amer 50 (*) >90 mL/min   GFR calc Af Amer 57 (*) >90 mL/min  I-STAT CHEM 8, ED      Result Value Ref Range   Sodium 137  137 - 147 mEq/L   Potassium 4.4  3.7 - 5.3 mEq/L   Chloride 97  96 - 112 mEq/L   BUN 24 (*) 6 - 23 mg/dL   Creatinine, Ser 1.50 (*) 0.50 - 1.35 mg/dL   Glucose, Bld 109 (*) 70 - 99 mg/dL   Calcium, Ion 1.17  1.13 - 1.30 mmol/L   TCO2 28  0 - 100 mmol/L   Hemoglobin 11.6 (*) 13.0 - 17.0 g/dL   HCT 34.0 (*) 39.0 - 52.0 %  I-STAT TROPOININ, ED      Result Value Ref Range   Troponin i, poc 0.00  0.00 - 0.08 ng/mL   Comment 3             The neurologically relevant items on the patient's problem list were reviewed on today's visit.  Neurologic Examination  A problem focused neurological exam (12 or more points of the single system neurologic examination, vital signs counts as 1 point, cranial nerves count for 8 points) was performed.  Blood pressure 167/96, pulse 61, weight 140 lb 12.8 oz (63.866 kg).  General - Well nourished, well developed, in no apparent distress.  Ophthalmologic - Sharp disc margins OU.  Cardiovascular - irregularly irregular heart rate and rhythm.  Mental Status -  Level of arousal and orientation to time, place, and person were intact. Language including expression, naming, repetition, comprehension, reading, and writing was assessed and found intact. Attention span and concentration  were normal.  Cranial Nerves II - XII - II - Vision intact OU. III, IV, VI - Extraocular movements intact. V - Facial sensation intact bilaterally. VII - Facial movement intact bilaterally. VIII - hard of hearing & vestibular intact bilaterally. X - Palate elevates symmetrically. XI - Chin turning & shoulder shrug intact bilaterally. XII - Tongue protrusion intact.  Motor Strength - The patient's strength was normal in all extremities and pronator drift was absent.  Bulk was normal and fasciculations were absent.   Motor Tone - Muscle tone was assessed at the neck and appendages and was normal.  Reflexes - The patient's reflexes were normal in all extremities and he had no pathological reflexes.  Sensory - Light touch, temperature/pinprick, vibration and proprioception, and Romberg testing were assessed and were normal.    Coordination - The patient had normal movements in the hands and feet with no ataxia or dysmetria.  Tremor was absent.  Gait and Station - The patient's transfers, posture, gait, station, and turns were observed as normal.  Data reviewed: I personally reviewed the images and agree with the radiology interpretations.  2D echo -  - Left ventricle: The cavity size was normal. Wall thickness   was increased in a pattern of mild LVH. Systolic function   was normal. The estimated ejection fraction was in the   range of 50% to 55%. Wall motion was normal; there were no   regional wall motion abnormalities. - Aortic valve: Mild regurgitation. - Mitral valve: Mild regurgitation. - Right ventricle: The cavity size was mildly dilated. - Right atrium: The atrium was moderately dilated. - Pulmonary arteries: PA peak pressure: 86mm Hg (S).  Carotid Doppler -  Findings suggest 1-39% internal carotid artery stenosis bilaterally. Vertebral arteries are patent with antegrade flow.  MRI and MRA -  Small area of acute infarction affecting the cortical and subcortical brain at  the left frontoparietal junction region. This is consistent with the patient's clinical presentation. No swelling or hemorrhage. Moderate chronic small vessel ischemic changes elsewhere throughout the brain. Intracranial MR angiography does not show any large vessel occlusion or correctable proximal stenosis.  Component     Latest Ref Rng 07/27/2013  Cholesterol     0 - 200 mg/dL 125  Triglycerides     <150 mg/dL 72  HDL     >39 mg/dL 33 (L)  Total CHOL/HDL Ratio      3.8  VLDL     0 - 40 mg/dL 14  LDL (calc)     0 - 99 mg/dL 78  Iron     42 - 135 ug/dL 18 (L)  TIBC     215 - 435 ug/dL 318  Saturation Ratios     20 - 55 % 6 (L)  UIBC     125 - 400 ug/dL 300  Retic Ct Pct     0.4 - 3.1 % 2.1  RBC.     4.22 - 5.81 MIL/uL 3.79 (L)  Retic Count, Manual     19.0 - 186.0 K/uL 79.6  Hemoglobin A1C     <5.7 % 6.0 (H)  Mean Plasma Glucose     <117 mg/dL 126 (H)  Vitamin B-12     211 - 911 pg/mL 438  Folate      >20.0  Ferritin     22 - 322 ng/mL 60  TSH     0.350 - 4.500 uIU/mL 3.580    Assessment: As you may recall, he is a  80 y.o. Caucasian male with PMH of Afib not on AC followed up in clinic due to left MCA small stroke. Stroke work up not remarkable and his stroke is likely due to afib. Currently he is on eliquis 5mg  bid after his kidney function improved. BP control well at home as per pt. He also has mild anemia and currently much improved after iron treatment.   Plan:  - continue eliquis 5mg  twice a day - continue metoprolol for the BP and HR control. - check BP at home and bring record to PCP - continue zocor for stroke prevention - following up with PCP for stroke risk factor modification - follow up 6 months  Diagnoses from this visit: Stroke  Paroxysmal atrial fibrillation  No orders of the defined types were placed in this encounter.   Meds ordered this encounter  Medications  . ferrous fumarate (HEMOCYTE - 106 MG FE) 325 (106 FE) MG TABS tablet     Sig: Take 1 tablet by mouth daily.   Patient Instructions  - BP checking twice a day, and record, and bring to your next visit to your PCP on 12/27/13 - continue eliquis 5mg  twice a day - continue metoprolol for the BP and HR control. - continue zocor for stroke prevention - following up with PCP for stroke risk factor modification - follow up 6 months    Rosalin Hawking, MD PhD [facility] [address] [place], [place] [address] [phone]

## 2013-12-13 NOTE — Patient Instructions (Signed)
-   BP checking twice a day, and record, and bring to your next visit to your PCP on 12/27/13 - continue eliquis 5mg  twice a day - continue metoprolol for the BP and HR control. - continue zocor for stroke prevention - following up with PCP for stroke risk factor modification - follow up 6 months

## 2014-03-28 ENCOUNTER — Other Ambulatory Visit: Payer: Self-pay | Admitting: Neurology

## 2014-04-07 ENCOUNTER — Encounter (INDEPENDENT_AMBULATORY_CARE_PROVIDER_SITE_OTHER): Payer: Medicare Other | Admitting: Ophthalmology

## 2014-04-07 DIAGNOSIS — H3532 Exudative age-related macular degeneration: Secondary | ICD-10-CM

## 2014-04-07 DIAGNOSIS — H3531 Nonexudative age-related macular degeneration: Secondary | ICD-10-CM

## 2014-04-07 DIAGNOSIS — I10 Essential (primary) hypertension: Secondary | ICD-10-CM

## 2014-04-07 DIAGNOSIS — H35033 Hypertensive retinopathy, bilateral: Secondary | ICD-10-CM

## 2014-04-07 DIAGNOSIS — H43813 Vitreous degeneration, bilateral: Secondary | ICD-10-CM

## 2014-06-02 ENCOUNTER — Encounter (INDEPENDENT_AMBULATORY_CARE_PROVIDER_SITE_OTHER): Payer: Medicare Other | Admitting: Ophthalmology

## 2014-06-06 ENCOUNTER — Encounter (INDEPENDENT_AMBULATORY_CARE_PROVIDER_SITE_OTHER): Payer: Medicare Other | Admitting: Ophthalmology

## 2014-06-06 DIAGNOSIS — H3532 Exudative age-related macular degeneration: Secondary | ICD-10-CM | POA: Diagnosis not present

## 2014-06-13 ENCOUNTER — Ambulatory Visit: Payer: Medicare Other | Admitting: Neurology

## 2014-06-16 ENCOUNTER — Encounter: Payer: Self-pay | Admitting: Neurology

## 2014-06-16 ENCOUNTER — Ambulatory Visit (INDEPENDENT_AMBULATORY_CARE_PROVIDER_SITE_OTHER): Payer: Medicare Other | Admitting: Neurology

## 2014-06-16 VITALS — Ht 69.0 in | Wt 150.2 lb

## 2014-06-16 DIAGNOSIS — I639 Cerebral infarction, unspecified: Secondary | ICD-10-CM | POA: Diagnosis not present

## 2014-06-16 DIAGNOSIS — I480 Paroxysmal atrial fibrillation: Secondary | ICD-10-CM | POA: Diagnosis not present

## 2014-06-16 DIAGNOSIS — I10 Essential (primary) hypertension: Secondary | ICD-10-CM

## 2014-06-16 DIAGNOSIS — E785 Hyperlipidemia, unspecified: Secondary | ICD-10-CM | POA: Diagnosis not present

## 2014-06-16 MED ORDER — METOPROLOL SUCCINATE ER 50 MG PO TB24: 50.0000 mg | ORAL_TABLET | Freq: Every day | ORAL | Status: DC

## 2014-06-16 NOTE — Patient Instructions (Addendum)
-   continue eliquis 5 mg twice a day and zocor for stroke prevention - will increase metoprolol from 25mg  to 50mg  for better BP control - check BP daily at home in the morning and record - Follow up with your primary care physician for stroke risk factor modification. Recommend maintain blood pressure goal <130/80, diabetes with hemoglobin A1c goal below 6.5% and lipids with LDL cholesterol goal below 70 mg/dL.  - regular exercise - follow up as needed.

## 2014-06-16 NOTE — Progress Notes (Signed)
STROKE NEUROLOGY FOLLOW UP NOTE  NAME: Manuel Armstrong DOB: 17-Jul-1919  REASON FOR VISIT: stroke follow up HISTORY FROM: pt and his daughter in law  Today we had the pleasure of seeing Manuel Armstrong in follow-up at our [facility]rology [facility]. Pt was accompanied by daughter in law.   History Summary 81 yo white male with PMH of Afib not on any anticoagulation was admitted in 07/2013 due to sudden onset right side weakness. No tPA given due to mild symptoms. Symptoms resolved quickly. MRI showed small cortical / subcortical infarct at left MCA territory. He was discharged with eliquis 2.5mg  bid as well as zocor. Also metoprolol for rate control.   10/07/13 follow up - pt has been doing well. His Cre improved so his eliquis increased to 5mg  bid. Resumed his iron pill once a day for anemia.   12/13/13 follow up - the patient has been doing well. BP controlled well, SBP around 130-145, sometimes 117. Continued on eliquis 5mg  bid without problem. No missing doses. Had appointment with PCP on 12/27/13.   Interval History During the interval time, pt was doing well. No recurrent stroke like symptoms. BP 157/95 today in clinic. He is on toprol 25 daily now. He takes med in the morning and checks BP around noon, usually 140s. If he check BP in the morning, it will be 150s. He is continued on eliquis 5mg  bid, no missing dose. He saw his PCP yesterday with blood drawn. As per pt, his BP was also high during that visit.  REVIEW OF SYSTEMS: Full 14 system review of systems performed and notable only for those listed below and in HPI above, all others are negative:  Constitutional: N/A  Cardiovascular: N/A  Ear/Nose/Throat: N/A  Skin: N/A  Eyes: N/A  Respiratory: N/A  Gastroitestinal: N/A  Hematology/Lymphatic:  easy bruising  Endocrine: N/A  Musculoskeletal: N/A  Allergy/Immunology: N/A Neurological: N/A  Psychiatric: N/A  The following represents the patient's updated allergies and side effects  list: No Known Allergies  Labs since last visit of relevance include the following: Results for orders placed or performed during the hospital encounter of 07/26/13  Ethanol  Result Value Ref Range   Alcohol, Ethyl (B) <11 0 - 11 mg/dL  Protime-INR  Result Value Ref Range   Prothrombin Time 13.1 11.6 - 15.2 seconds   INR 1.01 0.00 - 1.49  APTT  Result Value Ref Range   aPTT 33 24 - 37 seconds  CBC  Result Value Ref Range   WBC 5.5 4.0 - 10.5 K/uL   RBC 3.86 (L) 4.22 - 5.81 MIL/uL   Hemoglobin 10.3 (L) 13.0 - 17.0 g/dL   HCT 32.7 (L) 39.0 - 52.0 %   MCV 84.7 78.0 - 100.0 fL   MCH 26.7 26.0 - 34.0 pg   MCHC 31.5 30.0 - 36.0 g/dL   RDW 15.2 11.5 - 15.5 %   Platelets 261 150 - 400 K/uL  Differential  Result Value Ref Range   Neutrophils Relative % 66 43 - 77 %   Neutro Abs 3.6 1.7 - 7.7 K/uL   Lymphocytes Relative 20 12 - 46 %   Lymphs Abs 1.1 0.7 - 4.0 K/uL   Monocytes Relative 11 3 - 12 %   Monocytes Absolute 0.6 0.1 - 1.0 K/uL   Eosinophils Relative 2 0 - 5 %   Eosinophils Absolute 0.1 0.0 - 0.7 K/uL   Basophils Relative 1 0 - 1 %   Basophils Absolute 0.1 0.0 -  0.1 K/uL  Comprehensive metabolic panel  Result Value Ref Range   Sodium 135 (L) 137 - 147 mEq/L   Potassium 4.3 3.7 - 5.3 mEq/L   Chloride 95 (L) 96 - 112 mEq/L   CO2 27 19 - 32 mEq/L   Glucose, Bld 106 (H) 70 - 99 mg/dL   BUN 21 6 - 23 mg/dL   Creatinine, Ser 1.23 0.50 - 1.35 mg/dL   Calcium 9.2 8.4 - 10.5 mg/dL   Total Protein 7.3 6.0 - 8.3 g/dL   Albumin 3.5 3.5 - 5.2 g/dL   AST 22 0 - 37 U/L   ALT 16 0 - 53 U/L   Alkaline Phosphatase 78 39 - 117 U/L   Total Bilirubin 0.4 0.3 - 1.2 mg/dL   GFR calc non Af Amer 49 (L) >90 mL/min   GFR calc Af Amer 57 (L) >90 mL/min  Urine Drug Screen  Result Value Ref Range   Opiates NONE DETECTED NONE DETECTED   Cocaine NONE DETECTED NONE DETECTED   Benzodiazepines NONE DETECTED NONE DETECTED   Amphetamines NONE DETECTED NONE DETECTED   Tetrahydrocannabinol NONE  DETECTED NONE DETECTED   Barbiturates NONE DETECTED NONE DETECTED  Urinalysis, Routine w reflex microscopic  Result Value Ref Range   Color, Urine YELLOW YELLOW   APPearance CLEAR CLEAR   Specific Gravity, Urine 1.009 1.005 - 1.030   pH 7.0 5.0 - 8.0   Glucose, UA NEGATIVE NEGATIVE mg/dL   Hgb urine dipstick NEGATIVE NEGATIVE   Bilirubin Urine NEGATIVE NEGATIVE   Ketones, ur NEGATIVE NEGATIVE mg/dL   Protein, ur NEGATIVE NEGATIVE mg/dL   Urobilinogen, UA 1.0 0.0 - 1.0 mg/dL   Nitrite NEGATIVE NEGATIVE   Leukocytes, UA NEGATIVE NEGATIVE  Hemoglobin A1c  Result Value Ref Range   Hgb A1c MFr Bld 6.0 (H) <5.7 %   Mean Plasma Glucose 126 (H) <117 mg/dL  Lipid panel  Result Value Ref Range   Cholesterol 125 0 - 200 mg/dL   Triglycerides 72 <150 mg/dL   HDL 33 (L) >39 mg/dL   Total CHOL/HDL Ratio 3.8 RATIO   VLDL 14 0 - 40 mg/dL   LDL Cholesterol 78 0 - 99 mg/dL  Vitamin B12  Result Value Ref Range   Vitamin B-12 438 211 - 911 pg/mL  Folate  Result Value Ref Range   Folate >20.0 ng/mL  Iron and TIBC  Result Value Ref Range   Iron 18 (L) 42 - 135 ug/dL   TIBC 318 215 - 435 ug/dL   Saturation Ratios 6 (L) 20 - 55 %   UIBC 300 125 - 400 ug/dL  Ferritin  Result Value Ref Range   Ferritin 60 22 - 322 ng/mL  Reticulocytes  Result Value Ref Range   Retic Ct Pct 2.1 0.4 - 3.1 %   RBC. 3.79 (L) 4.22 - 5.81 MIL/uL   Retic Count, Manual 79.6 19.0 - 186.0 K/uL  Troponin I (q 6hr x 3)  Result Value Ref Range   Troponin I <0.30 <0.30 ng/mL  Troponin I (q 6hr x 3)  Result Value Ref Range   Troponin I <0.30 <0.30 ng/mL  Troponin I (q 6hr x 3)  Result Value Ref Range   Troponin I <0.30 <0.30 ng/mL  TSH  Result Value Ref Range   TSH 3.580 0.350 - 4.500 uIU/mL  Basic metabolic panel  Result Value Ref Range   Sodium 135 (L) 137 - 147 mEq/L   Potassium 4.7 3.7 - 5.3 mEq/L  Chloride 98 96 - 112 mEq/L   CO2 24 19 - 32 mEq/L   Glucose, Bld 159 (H) 70 - 99 mg/dL   BUN 24 (H) 6 -  23 mg/dL   Creatinine, Ser 1.17 0.50 - 1.35 mg/dL   Calcium 8.8 8.4 - 10.5 mg/dL   GFR calc non Af Amer 52 (L) >90 mL/min   GFR calc Af Amer 60 (L) >90 mL/min  CBC  Result Value Ref Range   WBC 5.9 4.0 - 10.5 K/uL   RBC 3.77 (L) 4.22 - 5.81 MIL/uL   Hemoglobin 10.1 (L) 13.0 - 17.0 g/dL   HCT 31.9 (L) 39.0 - 52.0 %   MCV 84.6 78.0 - 100.0 fL   MCH 26.8 26.0 - 34.0 pg   MCHC 31.7 30.0 - 36.0 g/dL   RDW 15.5 11.5 - 15.5 %   Platelets 254 150 - 400 K/uL  Basic metabolic panel  Result Value Ref Range   Sodium 143 137 - 147 mEq/L   Potassium 5.1 3.7 - 5.3 mEq/L   Chloride 106 96 - 112 mEq/L   CO2 28 19 - 32 mEq/L   Glucose, Bld 95 70 - 99 mg/dL   BUN 26 (H) 6 - 23 mg/dL   Creatinine, Ser 1.22 0.50 - 1.35 mg/dL   Calcium 8.8 8.4 - 10.5 mg/dL   GFR calc non Af Amer 50 (L) >90 mL/min   GFR calc Af Amer 57 (L) >90 mL/min  I-Stat Chem 8, ED  Result Value Ref Range   Sodium 137 137 - 147 mEq/L   Potassium 4.4 3.7 - 5.3 mEq/L   Chloride 97 96 - 112 mEq/L   BUN 24 (H) 6 - 23 mg/dL   Creatinine, Ser 1.50 (H) 0.50 - 1.35 mg/dL   Glucose, Bld 109 (H) 70 - 99 mg/dL   Calcium, Ion 1.17 1.13 - 1.30 mmol/L   TCO2 28 0 - 100 mmol/L   Hemoglobin 11.6 (L) 13.0 - 17.0 g/dL   HCT 34.0 (L) 39.0 - 52.0 %  I-stat troponin, ED (not at [facility])  Result Value Ref Range   Troponin i, poc 0.00 0.00 - 0.08 ng/mL   Comment 3            The neurologically relevant items on the patient's problem list were reviewed on today's visit.  Neurologic Examination  A problem focused neurological exam (12 or more points of the single system neurologic examination, vital signs counts as 1 point, cranial nerves count for 8 points) was performed.  Height 5\' 9"  (1.753 m), weight 150 lb 3.2 oz (68.13 kg).  General - Well nourished, well developed, in no apparent distress.  Ophthalmologic - Sharp disc margins OU.  Cardiovascular - irregularly irregular heart rate and rhythm.  Mental Status -  Level of arousal and  orientation to time, place, and person were intact. Language including expression, naming, repetition, comprehension, reading, and writing was assessed and found intact. Attention span and concentration were normal.  Cranial Nerves II - XII - II - Vision intact OU. III, IV, VI - Extraocular movements intact. V - Facial sensation intact bilaterally. VII - Facial movement intact bilaterally. VIII - hard of hearing & vestibular intact bilaterally. X - Palate elevates symmetrically. XI - Chin turning & shoulder shrug intact bilaterally. XII - Tongue protrusion intact.  Motor Strength - The patient's strength was normal in all extremities and pronator drift was absent.  Bulk was normal and fasciculations were absent.   Motor Tone -  Muscle tone was assessed at the neck and appendages and was normal.  Reflexes - The patient's reflexes were normal in all extremities and he had no pathological reflexes.  Sensory - Light touch, temperature/pinprick, vibration and proprioception, and Romberg testing were assessed and were normal.    Coordination - The patient had normal movements in the hands and feet with no ataxia or dysmetria.  Tremor was absent.  Gait and Station - The patient's transfers, posture, gait, station, and turns were observed as normal.  Data reviewed: I personally reviewed the images and agree with the radiology interpretations.  2D echo -  - Left ventricle: The cavity size was normal. Wall thickness   was increased in a pattern of mild LVH. Systolic function   was normal. The estimated ejection fraction was in the   range of 50% to 55%. Wall motion was normal; there were no   regional wall motion abnormalities. - Aortic valve: Mild regurgitation. - Mitral valve: Mild regurgitation. - Right ventricle: The cavity size was mildly dilated. - Right atrium: The atrium was moderately dilated. - Pulmonary arteries: PA peak pressure: 37mm Hg (S).  Carotid Doppler -  Findings  suggest 1-39% internal carotid artery stenosis bilaterally. Vertebral arteries are patent with antegrade flow.  MRI and MRA -  Small area of acute infarction affecting the cortical and subcortical brain at the left frontoparietal junction region. This is consistent with the patient's clinical presentation. No swelling or hemorrhage. Moderate chronic small vessel ischemic changes elsewhere throughout the brain. Intracranial MR angiography does not show any large vessel occlusion or correctable proximal stenosis.  Component     Latest Ref Rng 07/27/2013  Cholesterol     0 - 200 mg/dL 125  Triglycerides     <150 mg/dL 72  HDL     >39 mg/dL 33 (L)  Total CHOL/HDL Ratio      3.8  VLDL     0 - 40 mg/dL 14  LDL (calc)     0 - 99 mg/dL 78  Iron     42 - 135 ug/dL 18 (L)  TIBC     215 - 435 ug/dL 318  Saturation Ratios     20 - 55 % 6 (L)  UIBC     125 - 400 ug/dL 300  Retic Ct Pct     0.4 - 3.1 % 2.1  RBC.     4.22 - 5.81 MIL/uL 3.79 (L)  Retic Count, Manual     19.0 - 186.0 K/uL 79.6  Hemoglobin A1C     <5.7 % 6.0 (H)  Mean Plasma Glucose     <117 mg/dL 126 (H)  Vitamin B-12     211 - 911 pg/mL 438  Folate      >20.0  Ferritin     22 - 322 ng/mL 60  TSH     0.350 - 4.500 uIU/mL 3.580    Assessment: As you may recall, he is a 81 y.o. Caucasian male with PMH of Afib not on AC followed up in clinic due to left MCA small stroke. Stroke work up not remarkable and his stroke is likely due to afib. Currently he is on eliquis 5mg  bid without side effects. BP over the target goal currently, will increase toprol to 50mg  daily. Continue zocor  Plan:  - continue eliquis 5mg  twice a day and zocor for stroke prevention - increase toprol to 50mg  daily for better BP control. - check BP in the morning at home  and record - Follow up with your primary care physician for stroke risk factor modification. Recommend maintain blood pressure goal <130/80, diabetes with hemoglobin A1c goal below  6.5% and lipids with LDL cholesterol goal below 70 mg/dL.  - RTC PRN  Diagnoses from this visit: Stroke  Paroxysmal atrial fibrillation  Essential hypertension  HLD (hyperlipidemia)  No orders of the defined types were placed in this encounter.   Meds ordered this encounter  Medications  . BESIVANCE 0.6 % SUSP    Sig: Place 1 drop into the right eye 4 (four) times daily.    Refill:  12  . metoprolol succinate (TOPROL-XL) 50 MG 24 hr tablet    Sig: Take 1 tablet (50 mg total) by mouth daily. Take with or immediately following a meal.    Dispense:  30 tablet    Refill:  5   Patient Instructions  - continue eliquis 5 mg twice a day and zocor for stroke prevention - will increase metoprolol from 25mg  to 50mg  for better BP control - check BP daily at home in the morning and record - Follow up with your primary care physician for stroke risk factor modification. Recommend maintain blood pressure goal <130/80, diabetes with hemoglobin A1c goal below 6.5% and lipids with LDL cholesterol goal below 70 mg/dL.  - regular exercise - follow up as needed.   Rosalin Hawking, MD PhD [facility] [address] [place], [place] [address] [phone]

## 2014-06-23 ENCOUNTER — Other Ambulatory Visit: Payer: Self-pay | Admitting: Neurology

## 2014-08-18 ENCOUNTER — Encounter (INDEPENDENT_AMBULATORY_CARE_PROVIDER_SITE_OTHER): Payer: Medicare Other | Admitting: Ophthalmology

## 2014-08-18 DIAGNOSIS — I10 Essential (primary) hypertension: Secondary | ICD-10-CM | POA: Diagnosis not present

## 2014-08-18 DIAGNOSIS — H3532 Exudative age-related macular degeneration: Secondary | ICD-10-CM | POA: Diagnosis not present

## 2014-08-18 DIAGNOSIS — H43813 Vitreous degeneration, bilateral: Secondary | ICD-10-CM | POA: Diagnosis not present

## 2014-08-18 DIAGNOSIS — H3531 Nonexudative age-related macular degeneration: Secondary | ICD-10-CM | POA: Diagnosis not present

## 2014-08-18 DIAGNOSIS — H35033 Hypertensive retinopathy, bilateral: Secondary | ICD-10-CM | POA: Diagnosis not present

## 2014-09-19 ENCOUNTER — Other Ambulatory Visit: Payer: Self-pay

## 2014-10-20 ENCOUNTER — Encounter (INDEPENDENT_AMBULATORY_CARE_PROVIDER_SITE_OTHER): Payer: Medicare Other | Admitting: Ophthalmology

## 2014-10-20 DIAGNOSIS — H43813 Vitreous degeneration, bilateral: Secondary | ICD-10-CM | POA: Diagnosis not present

## 2014-10-20 DIAGNOSIS — H3532 Exudative age-related macular degeneration: Secondary | ICD-10-CM

## 2014-10-20 DIAGNOSIS — H3531 Nonexudative age-related macular degeneration: Secondary | ICD-10-CM | POA: Diagnosis not present

## 2014-10-20 DIAGNOSIS — I10 Essential (primary) hypertension: Secondary | ICD-10-CM

## 2014-10-20 DIAGNOSIS — H35033 Hypertensive retinopathy, bilateral: Secondary | ICD-10-CM | POA: Diagnosis not present

## 2014-12-22 ENCOUNTER — Encounter (INDEPENDENT_AMBULATORY_CARE_PROVIDER_SITE_OTHER): Payer: Medicare Other | Admitting: Ophthalmology

## 2014-12-22 DIAGNOSIS — H3532 Exudative age-related macular degeneration: Secondary | ICD-10-CM | POA: Diagnosis not present

## 2014-12-22 DIAGNOSIS — H43813 Vitreous degeneration, bilateral: Secondary | ICD-10-CM | POA: Diagnosis not present

## 2014-12-22 DIAGNOSIS — I10 Essential (primary) hypertension: Secondary | ICD-10-CM | POA: Diagnosis not present

## 2014-12-22 DIAGNOSIS — H3531 Nonexudative age-related macular degeneration: Secondary | ICD-10-CM

## 2014-12-22 DIAGNOSIS — H35033 Hypertensive retinopathy, bilateral: Secondary | ICD-10-CM

## 2014-12-26 ENCOUNTER — Other Ambulatory Visit: Payer: Self-pay | Admitting: Neurology

## 2015-03-09 ENCOUNTER — Encounter (INDEPENDENT_AMBULATORY_CARE_PROVIDER_SITE_OTHER): Payer: Medicare Other | Admitting: Ophthalmology

## 2015-03-09 DIAGNOSIS — H35033 Hypertensive retinopathy, bilateral: Secondary | ICD-10-CM | POA: Diagnosis not present

## 2015-03-09 DIAGNOSIS — H353124 Nonexudative age-related macular degeneration, left eye, advanced atrophic with subfoveal involvement: Secondary | ICD-10-CM

## 2015-03-09 DIAGNOSIS — H43813 Vitreous degeneration, bilateral: Secondary | ICD-10-CM

## 2015-03-09 DIAGNOSIS — H353211 Exudative age-related macular degeneration, right eye, with active choroidal neovascularization: Secondary | ICD-10-CM

## 2015-03-09 DIAGNOSIS — I10 Essential (primary) hypertension: Secondary | ICD-10-CM

## 2015-06-01 ENCOUNTER — Encounter (INDEPENDENT_AMBULATORY_CARE_PROVIDER_SITE_OTHER): Payer: Medicare Other | Admitting: Ophthalmology

## 2015-06-01 DIAGNOSIS — I10 Essential (primary) hypertension: Secondary | ICD-10-CM | POA: Diagnosis not present

## 2015-06-01 DIAGNOSIS — H43813 Vitreous degeneration, bilateral: Secondary | ICD-10-CM | POA: Diagnosis not present

## 2015-06-01 DIAGNOSIS — H35033 Hypertensive retinopathy, bilateral: Secondary | ICD-10-CM | POA: Diagnosis not present

## 2015-06-01 DIAGNOSIS — H353231 Exudative age-related macular degeneration, bilateral, with active choroidal neovascularization: Secondary | ICD-10-CM

## 2015-06-11 ENCOUNTER — Encounter (HOSPITAL_COMMUNITY): Admission: EM | Disposition: E | Payer: Self-pay | Source: Home / Self Care | Attending: Vascular Surgery

## 2015-06-11 ENCOUNTER — Inpatient Hospital Stay (HOSPITAL_COMMUNITY): Payer: Medicare Other

## 2015-06-11 ENCOUNTER — Emergency Department (HOSPITAL_COMMUNITY): Payer: Medicare Other | Admitting: Anesthesiology

## 2015-06-11 ENCOUNTER — Emergency Department (HOSPITAL_COMMUNITY): Payer: Medicare Other

## 2015-06-11 ENCOUNTER — Inpatient Hospital Stay (HOSPITAL_COMMUNITY)
Admission: EM | Admit: 2015-06-11 | Discharge: 2015-06-24 | DRG: 252 | Disposition: E | Payer: Medicare Other | Attending: Vascular Surgery | Admitting: Vascular Surgery

## 2015-06-11 ENCOUNTER — Encounter (HOSPITAL_COMMUNITY): Payer: Self-pay | Admitting: Emergency Medicine

## 2015-06-11 DIAGNOSIS — Z9889 Other specified postprocedural states: Secondary | ICD-10-CM

## 2015-06-11 DIAGNOSIS — N280 Ischemia and infarction of kidney: Secondary | ICD-10-CM | POA: Diagnosis present

## 2015-06-11 DIAGNOSIS — Z87891 Personal history of nicotine dependence: Secondary | ICD-10-CM | POA: Diagnosis not present

## 2015-06-11 DIAGNOSIS — K660 Peritoneal adhesions (postprocedural) (postinfection): Secondary | ICD-10-CM | POA: Diagnosis present

## 2015-06-11 DIAGNOSIS — Z66 Do not resuscitate: Secondary | ICD-10-CM | POA: Diagnosis present

## 2015-06-11 DIAGNOSIS — D62 Acute posthemorrhagic anemia: Secondary | ICD-10-CM | POA: Diagnosis not present

## 2015-06-11 DIAGNOSIS — N179 Acute kidney failure, unspecified: Secondary | ICD-10-CM | POA: Diagnosis present

## 2015-06-11 DIAGNOSIS — R34 Anuria and oliguria: Secondary | ICD-10-CM | POA: Diagnosis not present

## 2015-06-11 DIAGNOSIS — Z8673 Personal history of transient ischemic attack (TIA), and cerebral infarction without residual deficits: Secondary | ICD-10-CM | POA: Diagnosis not present

## 2015-06-11 DIAGNOSIS — I713 Abdominal aortic aneurysm, ruptured, unspecified: Secondary | ICD-10-CM | POA: Diagnosis present

## 2015-06-11 DIAGNOSIS — R571 Hypovolemic shock: Secondary | ICD-10-CM | POA: Diagnosis present

## 2015-06-11 DIAGNOSIS — E875 Hyperkalemia: Secondary | ICD-10-CM | POA: Diagnosis present

## 2015-06-11 DIAGNOSIS — E872 Acidosis: Secondary | ICD-10-CM | POA: Diagnosis not present

## 2015-06-11 DIAGNOSIS — Z515 Encounter for palliative care: Secondary | ICD-10-CM | POA: Diagnosis not present

## 2015-06-11 DIAGNOSIS — R569 Unspecified convulsions: Secondary | ICD-10-CM | POA: Diagnosis present

## 2015-06-11 DIAGNOSIS — R109 Unspecified abdominal pain: Secondary | ICD-10-CM

## 2015-06-11 DIAGNOSIS — N2889 Other specified disorders of kidney and ureter: Secondary | ICD-10-CM | POA: Diagnosis present

## 2015-06-11 DIAGNOSIS — I10 Essential (primary) hypertension: Secondary | ICD-10-CM | POA: Diagnosis present

## 2015-06-11 DIAGNOSIS — I482 Chronic atrial fibrillation: Secondary | ICD-10-CM | POA: Diagnosis present

## 2015-06-11 DIAGNOSIS — I959 Hypotension, unspecified: Secondary | ICD-10-CM

## 2015-06-11 DIAGNOSIS — K7200 Acute and subacute hepatic failure without coma: Secondary | ICD-10-CM | POA: Diagnosis present

## 2015-06-11 DIAGNOSIS — R570 Cardiogenic shock: Secondary | ICD-10-CM | POA: Diagnosis not present

## 2015-06-11 DIAGNOSIS — K661 Hemoperitoneum: Secondary | ICD-10-CM | POA: Diagnosis present

## 2015-06-11 DIAGNOSIS — Z8679 Personal history of other diseases of the circulatory system: Secondary | ICD-10-CM

## 2015-06-11 DIAGNOSIS — Z7901 Long term (current) use of anticoagulants: Secondary | ICD-10-CM | POA: Diagnosis not present

## 2015-06-11 DIAGNOSIS — Z8546 Personal history of malignant neoplasm of prostate: Secondary | ICD-10-CM | POA: Diagnosis not present

## 2015-06-11 DIAGNOSIS — I4891 Unspecified atrial fibrillation: Secondary | ICD-10-CM | POA: Diagnosis present

## 2015-06-11 DIAGNOSIS — J9601 Acute respiratory failure with hypoxia: Secondary | ICD-10-CM | POA: Diagnosis not present

## 2015-06-11 DIAGNOSIS — I714 Abdominal aortic aneurysm, without rupture, unspecified: Secondary | ICD-10-CM

## 2015-06-11 HISTORY — PX: ABDOMINAL AORTIC ANEURYSM REPAIR: SHX42

## 2015-06-11 LAB — POCT I-STAT 7, (LYTES, BLD GAS, ICA,H+H)
ACID-BASE DEFICIT: 6 mmol/L — AB (ref 0.0–2.0)
Acid-base deficit: 8 mmol/L — ABNORMAL HIGH (ref 0.0–2.0)
Acid-base deficit: 6 mmol/L — ABNORMAL HIGH (ref 0.0–2.0)
Acid-base deficit: 7 mmol/L — ABNORMAL HIGH (ref 0.0–2.0)
BICARBONATE: 19.3 meq/L — AB (ref 20.0–24.0)
BICARBONATE: 19.4 meq/L — AB (ref 20.0–24.0)
Bicarbonate: 18.2 mEq/L — ABNORMAL LOW (ref 20.0–24.0)
Bicarbonate: 19 mEq/L — ABNORMAL LOW (ref 20.0–24.0)
CALCIUM ION: 0.59 mmol/L — AB (ref 1.13–1.30)
Calcium, Ion: 0.6 mmol/L — CL (ref 1.13–1.30)
Calcium, Ion: 0.79 mmol/L — ABNORMAL LOW (ref 1.13–1.30)
Calcium, Ion: 0.83 mmol/L — ABNORMAL LOW (ref 1.13–1.30)
HCT: 19 % — ABNORMAL LOW (ref 39.0–52.0)
HCT: 24 % — ABNORMAL LOW (ref 39.0–52.0)
HCT: 28 % — ABNORMAL LOW (ref 39.0–52.0)
HEMATOCRIT: 24 % — AB (ref 39.0–52.0)
HEMOGLOBIN: 8.2 g/dL — AB (ref 13.0–17.0)
Hemoglobin: 6.5 g/dL — CL (ref 13.0–17.0)
Hemoglobin: 8.2 g/dL — ABNORMAL LOW (ref 13.0–17.0)
Hemoglobin: 9.5 g/dL — ABNORMAL LOW (ref 13.0–17.0)
O2 SAT: 100 %
O2 SAT: 100 %
O2 SAT: 100 %
O2 Saturation: 100 %
PCO2 ART: 35.6 mmHg (ref 35.0–45.0)
PCO2 ART: 37.7 mmHg (ref 35.0–45.0)
PH ART: 7.31 — AB (ref 7.350–7.450)
PH ART: 7.308 — AB (ref 7.350–7.450)
PH ART: 7.394 (ref 7.350–7.450)
PH ART: 7.399 (ref 7.350–7.450)
PO2 ART: 477 mmHg — AB (ref 80.0–100.0)
POTASSIUM: 4.9 mmol/L (ref 3.5–5.1)
POTASSIUM: 4.2 mmol/L (ref 3.5–5.1)
Patient temperature: 35
Patient temperature: 32.8
Potassium: 5.3 mmol/L — ABNORMAL HIGH (ref 3.5–5.1)
Potassium: 4.4 mmol/L (ref 3.5–5.1)
SODIUM: 133 mmol/L — AB (ref 135–145)
Sodium: 132 mmol/L — ABNORMAL LOW (ref 135–145)
Sodium: 132 mmol/L — ABNORMAL LOW (ref 135–145)
Sodium: 136 mmol/L (ref 135–145)
TCO2: 19 mmol/L (ref 0–100)
TCO2: 20 mmol/L (ref 0–100)
TCO2: 21 mmol/L (ref 0–100)
TCO2: 20 mmol/L (ref 0–100)
pCO2 arterial: 30.5 mmHg — ABNORMAL LOW (ref 35.0–45.0)
pCO2 arterial: 30 mmHg — ABNORMAL LOW (ref 35.0–45.0)
pO2, Arterial: 515 mmHg — ABNORMAL HIGH (ref 80.0–100.0)
pO2, Arterial: 448 mmHg — ABNORMAL HIGH (ref 80.0–100.0)
pO2, Arterial: 551 mmHg — ABNORMAL HIGH (ref 80.0–100.0)

## 2015-06-11 LAB — URINALYSIS, ROUTINE W REFLEX MICROSCOPIC
BILIRUBIN URINE: NEGATIVE
Glucose, UA: NEGATIVE mg/dL
Hgb urine dipstick: NEGATIVE
KETONES UR: 15 mg/dL — AB
LEUKOCYTES UA: NEGATIVE
NITRITE: NEGATIVE
PROTEIN: 30 mg/dL — AB
Specific Gravity, Urine: 1.018 (ref 1.005–1.030)
pH: 6 (ref 5.0–8.0)

## 2015-06-11 LAB — COMPREHENSIVE METABOLIC PANEL
ALK PHOS: 83 U/L (ref 38–126)
ALT: 20 U/L (ref 17–63)
AST: 32 U/L (ref 15–41)
Albumin: 3.9 g/dL (ref 3.5–5.0)
Anion gap: 15 (ref 5–15)
BILIRUBIN TOTAL: 1.2 mg/dL (ref 0.3–1.2)
BUN: 30 mg/dL — AB (ref 6–20)
CALCIUM: 9.5 mg/dL (ref 8.9–10.3)
CO2: 23 mmol/L (ref 22–32)
CREATININE: 1.35 mg/dL — AB (ref 0.61–1.24)
Chloride: 98 mmol/L — ABNORMAL LOW (ref 101–111)
GFR calc Af Amer: 50 mL/min — ABNORMAL LOW (ref >60)
GFR, EST NON AFRICAN AMERICAN: 43 mL/min — AB (ref >60)
GLUCOSE: 128 mg/dL — AB (ref 65–99)
Potassium: 4.7 mmol/L (ref 3.5–5.1)
Sodium: 136 mmol/L (ref 135–145)
TOTAL PROTEIN: 7.6 g/dL (ref 6.5–8.1)

## 2015-06-11 LAB — DIC (DISSEMINATED INTRAVASCULAR COAGULATION)PANEL
D-Dimer, Quant: 3.99 ug/mL-FEU — ABNORMAL HIGH (ref 0.00–0.50)
Fibrinogen: 195 mg/dL — ABNORMAL LOW (ref 204–475)
INR: 2.15 — ABNORMAL HIGH (ref 0.00–1.49)
Platelets: 44 10*3/uL — ABNORMAL LOW (ref 150–400)
Smear Review: NONE SEEN
aPTT: 47 seconds — ABNORMAL HIGH (ref 24–37)

## 2015-06-11 LAB — HEMOGLOBIN AND HEMATOCRIT, BLOOD
HEMATOCRIT: 31.3 % — AB (ref 39.0–52.0)
Hemoglobin: 10.1 g/dL — ABNORMAL LOW (ref 13.0–17.0)

## 2015-06-11 LAB — APTT
aPTT: 36 seconds (ref 24–37)
aPTT: 54 seconds — ABNORMAL HIGH (ref 24–37)

## 2015-06-11 LAB — GLUCOSE, CAPILLARY: Glucose-Capillary: 136 mg/dL — ABNORMAL HIGH (ref 65–99)

## 2015-06-11 LAB — PLATELET COUNT: PLATELETS: 44 10*3/uL — AB (ref 150–400)

## 2015-06-11 LAB — POCT I-STAT 4, (NA,K, GLUC, HGB,HCT)
GLUCOSE: 267 mg/dL — AB (ref 65–99)
Glucose, Bld: 256 mg/dL — ABNORMAL HIGH (ref 65–99)
HCT: 29 % — ABNORMAL LOW (ref 39.0–52.0)
HEMATOCRIT: 28 % — AB (ref 39.0–52.0)
HEMOGLOBIN: 9.5 g/dL — AB (ref 13.0–17.0)
HEMOGLOBIN: 9.9 g/dL — AB (ref 13.0–17.0)
POTASSIUM: 4.4 mmol/L (ref 3.5–5.1)
Potassium: 5.3 mmol/L — ABNORMAL HIGH (ref 3.5–5.1)
Sodium: 134 mmol/L — ABNORMAL LOW (ref 135–145)
Sodium: 137 mmol/L (ref 135–145)

## 2015-06-11 LAB — CBC
HCT: 38.1 % — ABNORMAL LOW (ref 39.0–52.0)
HCT: 20.4 % — ABNORMAL LOW (ref 39.0–52.0)
HEMOGLOBIN: 6.8 g/dL — AB (ref 13.0–17.0)
Hemoglobin: 12.3 g/dL — ABNORMAL LOW (ref 13.0–17.0)
MCH: 29.6 pg (ref 26.0–34.0)
MCH: 28.5 pg (ref 26.0–34.0)
MCHC: 32.3 g/dL (ref 30.0–36.0)
MCHC: 33.3 g/dL (ref 30.0–36.0)
MCV: 91.6 fL (ref 78.0–100.0)
MCV: 85.4 fL (ref 78.0–100.0)
PLATELETS: 195 10*3/uL (ref 150–400)
Platelets: 86 10*3/uL — ABNORMAL LOW (ref 150–400)
RBC: 4.16 MIL/uL — ABNORMAL LOW (ref 4.22–5.81)
RBC: 2.39 MIL/uL — AB (ref 4.22–5.81)
RDW: 13.1 % (ref 11.5–15.5)
RDW: 15.1 % (ref 11.5–15.5)
WBC: 9.2 10*3/uL (ref 4.0–10.5)
WBC: 6.1 10*3/uL (ref 4.0–10.5)

## 2015-06-11 LAB — DIC (DISSEMINATED INTRAVASCULAR COAGULATION) PANEL: PROTHROMBIN TIME: 23.8 s — AB (ref 11.6–15.2)

## 2015-06-11 LAB — BASIC METABOLIC PANEL
Anion gap: 18 — ABNORMAL HIGH (ref 5–15)
BUN: 23 mg/dL — ABNORMAL HIGH (ref 6–20)
CALCIUM: 8.2 mg/dL — AB (ref 8.9–10.3)
CHLORIDE: 102 mmol/L (ref 101–111)
CO2: 33 mmol/L — ABNORMAL HIGH (ref 22–32)
CREATININE: 1.27 mg/dL — AB (ref 0.61–1.24)
GFR calc non Af Amer: 47 mL/min — ABNORMAL LOW (ref >60)
GFR, EST AFRICAN AMERICAN: 54 mL/min — AB (ref >60)
Glucose, Bld: 208 mg/dL — ABNORMAL HIGH (ref 65–99)
Potassium: 4.2 mmol/L (ref 3.5–5.1)
SODIUM: 153 mmol/L — AB (ref 135–145)

## 2015-06-11 LAB — PROTIME-INR
INR: 1.79 — AB (ref 0.00–1.49)
INR: 2.33 — AB (ref 0.00–1.49)
PROTHROMBIN TIME: 20.7 s — AB (ref 11.6–15.2)
PROTHROMBIN TIME: 25.3 s — AB (ref 11.6–15.2)

## 2015-06-11 LAB — MASSIVE TRANSFUSION PROTOCOL ORDER (BLOOD BANK NOTIFICATION)

## 2015-06-11 LAB — ABO/RH: ABO/RH(D): AB POS

## 2015-06-11 LAB — PREPARE RBC (CROSSMATCH)

## 2015-06-11 LAB — LIPASE, BLOOD: Lipase: 37 U/L (ref 11–51)

## 2015-06-11 LAB — URINE MICROSCOPIC-ADD ON
Bacteria, UA: NONE SEEN
RBC / HPF: NONE SEEN RBC/hpf (ref 0–5)
WBC UA: NONE SEEN WBC/hpf (ref 0–5)

## 2015-06-11 LAB — MAGNESIUM: Magnesium: 1.5 mg/dL — ABNORMAL LOW (ref 1.7–2.4)

## 2015-06-11 SURGERY — ANEURYSM ABDOMINAL AORTIC REPAIR
Anesthesia: General

## 2015-06-11 MED ORDER — ONDANSETRON 4 MG PO TBDP
4.0000 mg | ORAL_TABLET | Freq: Once | ORAL | Status: AC | PRN
  Administered 2015-06-11: 4 mg via ORAL

## 2015-06-11 MED ORDER — SODIUM BICARBONATE 8.4 % IV SOLN
100.0000 meq | Freq: Once | INTRAVENOUS | Status: AC
  Administered 2015-06-11: 100 meq via INTRAVENOUS

## 2015-06-11 MED ORDER — LABETALOL HCL 5 MG/ML IV SOLN: 10.0000 mg | INTRAVENOUS | Status: DC | PRN

## 2015-06-11 MED ORDER — CEFAZOLIN SODIUM-DEXTROSE 2-3 GM-% IV SOLR
INTRAVENOUS | Status: DC | PRN
  Administered 2015-06-11 (×2): 2 g via INTRAVENOUS

## 2015-06-11 MED ORDER — SODIUM CHLORIDE 0.9 % IJ SOLN
INTRAMUSCULAR | Status: AC
  Filled 2015-06-11: qty 10

## 2015-06-11 MED ORDER — MAGNESIUM SULFATE 2 GM/50ML IV SOLN: 2.0000 g | Freq: Every day | INTRAVENOUS | Status: DC | PRN

## 2015-06-11 MED ORDER — ROCURONIUM BROMIDE 50 MG/5ML IV SOLN
INTRAVENOUS | Status: AC
  Filled 2015-06-11: qty 1

## 2015-06-11 MED ORDER — CHLORHEXIDINE GLUCONATE 0.12% ORAL RINSE (MEDLINE KIT)
15.0000 mL | Freq: Two times a day (BID) | OROMUCOSAL | Status: DC
  Administered 2015-06-11 – 2015-06-12 (×2): 15 mL via OROMUCOSAL

## 2015-06-11 MED ORDER — NOREPINEPHRINE BITARTRATE 1 MG/ML IV SOLN
4000.0000 ug | INTRAVENOUS | Status: DC | PRN
  Administered 2015-06-11: 2 ug/min via INTRAVENOUS

## 2015-06-11 MED ORDER — SODIUM CHLORIDE 0.9 % IV SOLN
INTRAVENOUS | Status: DC | PRN
  Administered 2015-06-11: 19:00:00 via INTRAVENOUS

## 2015-06-11 MED ORDER — MIDAZOLAM HCL 2 MG/2ML IJ SOLN
INTRAMUSCULAR | Status: AC
  Filled 2015-06-11: qty 2

## 2015-06-11 MED ORDER — THROMBIN 20000 UNITS EX KIT
PACK | CUTANEOUS | Status: AC
  Filled 2015-06-11: qty 1

## 2015-06-11 MED ORDER — FENTANYL CITRATE (PF) 100 MCG/2ML IJ SOLN
50.0000 ug | INTRAMUSCULAR | Status: DC | PRN
Start: 2015-06-11 — End: 2015-06-12

## 2015-06-11 MED ORDER — ONDANSETRON HCL 4 MG/2ML IJ SOLN: 4.0000 mg | Freq: Four times a day (QID) | INTRAMUSCULAR | Status: DC | PRN

## 2015-06-11 MED ORDER — CEFAZOLIN SODIUM 1 G IJ SOLR
INTRAMUSCULAR | Status: AC
  Filled 2015-06-11: qty 10

## 2015-06-11 MED ORDER — POTASSIUM CHLORIDE CRYS ER 20 MEQ PO TBCR: 20.0000 meq | EXTENDED_RELEASE_TABLET | Freq: Every day | ORAL | Status: DC | PRN

## 2015-06-11 MED ORDER — CALCIUM CHLORIDE 10 % IV SOLN
INTRAVENOUS | Status: DC | PRN
  Administered 2015-06-11 (×5): 200 mg via INTRAVENOUS

## 2015-06-11 MED ORDER — ETOMIDATE 2 MG/ML IV SOLN
INTRAVENOUS | Status: DC | PRN
  Administered 2015-06-11: 8 mg via INTRAVENOUS

## 2015-06-11 MED ORDER — OXYCODONE-ACETAMINOPHEN 5-325 MG PO TABS
1.0000 | ORAL_TABLET | Freq: Once | ORAL | Status: AC
  Administered 2015-06-11: 1 via ORAL

## 2015-06-11 MED ORDER — MANNITOL 25 % IV SOLN
INTRAVENOUS | Status: DC | PRN
  Administered 2015-06-11 (×2): 12.5 g via INTRAVENOUS

## 2015-06-11 MED ORDER — MORPHINE SULFATE (PF) 2 MG/ML IV SOLN
1.0000 mg | INTRAVENOUS | Status: DC | PRN
  Administered 2015-06-12 (×2): 2 mg via INTRAVENOUS
  Filled 2015-06-11: qty 2

## 2015-06-11 MED ORDER — SUCCINYLCHOLINE CHLORIDE 20 MG/ML IJ SOLN
INTRAMUSCULAR | Status: AC
  Filled 2015-06-11: qty 1

## 2015-06-11 MED ORDER — MIDAZOLAM HCL 5 MG/5ML IJ SOLN
INTRAMUSCULAR | Status: DC | PRN
  Administered 2015-06-11: 2 mg via INTRAVENOUS

## 2015-06-11 MED ORDER — MANNITOL 25 % IV SOLN
50.0000 g | Freq: Once | INTRAVENOUS | Status: DC
  Filled 2015-06-11: qty 200

## 2015-06-11 MED ORDER — PHENYLEPHRINE HCL 10 MG/ML IJ SOLN
INTRAMUSCULAR | Status: DC | PRN
  Administered 2015-06-11: 25 ug via INTRAVENOUS
  Administered 2015-06-11 (×4): 80 ug via INTRAVENOUS

## 2015-06-11 MED ORDER — PHENOL 1.4 % MT LIQD: 1.0000 | OROMUCOSAL | Status: DC | PRN

## 2015-06-11 MED ORDER — SUFENTANIL CITRATE 50 MCG/ML IV SOLN
INTRAVENOUS | Status: DC | PRN
  Administered 2015-06-11: 5 ug via INTRAVENOUS
  Administered 2015-06-11 (×2): 10 ug via INTRAVENOUS
  Administered 2015-06-11: 5 ug via INTRAVENOUS

## 2015-06-11 MED ORDER — PANTOPRAZOLE SODIUM 40 MG IV SOLR
40.0000 mg | Freq: Every day | INTRAVENOUS | Status: DC
  Administered 2015-06-12: 40 mg via INTRAVENOUS
  Filled 2015-06-11: qty 40

## 2015-06-11 MED ORDER — SODIUM CHLORIDE 0.9 % IV SOLN
Freq: Once | INTRAVENOUS | Status: AC
  Administered 2015-06-11: 23:00:00 via INTRAVENOUS

## 2015-06-11 MED ORDER — EPHEDRINE SULFATE 50 MG/ML IJ SOLN
INTRAMUSCULAR | Status: AC
  Filled 2015-06-11: qty 1

## 2015-06-11 MED ORDER — PROTAMINE SULFATE 10 MG/ML IV SOLN
INTRAVENOUS | Status: DC | PRN
  Administered 2015-06-11: 50 mg via INTRAVENOUS

## 2015-06-11 MED ORDER — NOREPINEPHRINE BITARTRATE 1 MG/ML IV SOLN
0.0000 ug/min | INTRAVENOUS | Status: DC
  Filled 2015-06-11: qty 4

## 2015-06-11 MED ORDER — THROMBIN 20000 UNITS EX SOLR
CUTANEOUS | Status: DC | PRN
  Administered 2015-06-11: 19:00:00 via TOPICAL

## 2015-06-11 MED ORDER — INSULIN ASPART 100 UNIT/ML ~~LOC~~ SOLN
SUBCUTANEOUS | Status: AC
  Filled 2015-06-11: qty 10

## 2015-06-11 MED ORDER — ONDANSETRON 4 MG PO TBDP
ORAL_TABLET | ORAL | Status: AC
  Filled 2015-06-11: qty 1

## 2015-06-11 MED ORDER — HYDRALAZINE HCL 20 MG/ML IJ SOLN: 5.0000 mg | INTRAMUSCULAR | Status: DC | PRN

## 2015-06-11 MED ORDER — KCL IN DEXTROSE-NACL 20-5-0.45 MEQ/L-%-% IV SOLN
INTRAVENOUS | Status: DC
  Administered 2015-06-11: 23:00:00 via INTRAVENOUS
  Filled 2015-06-11 (×3): qty 1000

## 2015-06-11 MED ORDER — METOPROLOL TARTRATE 1 MG/ML IV SOLN: 2.0000 mg | INTRAVENOUS | Status: DC | PRN

## 2015-06-11 MED ORDER — GUAIFENESIN-DM 100-10 MG/5ML PO SYRP: 15.0000 mL | ORAL_SOLUTION | ORAL | Status: DC | PRN

## 2015-06-11 MED ORDER — SODIUM BICARBONATE 8.4 % IV SOLN
INTRAVENOUS | Status: DC | PRN
  Administered 2015-06-11: 25 meq via INTRAVENOUS

## 2015-06-11 MED ORDER — LACTATED RINGERS IV SOLN
INTRAVENOUS | Status: DC | PRN
Start: 2015-06-11 — End: 2015-06-11
  Administered 2015-06-11: 17:00:00 via INTRAVENOUS

## 2015-06-11 MED ORDER — 0.9 % SODIUM CHLORIDE (POUR BTL) OPTIME
TOPICAL | Status: DC | PRN
  Administered 2015-06-11 (×2): 1000 mL

## 2015-06-11 MED ORDER — ALUM & MAG HYDROXIDE-SIMETH 200-200-20 MG/5ML PO SUSP: 15.0000 mL | ORAL | Status: DC | PRN

## 2015-06-11 MED ORDER — DEXMEDETOMIDINE HCL IN NACL 200 MCG/50ML IV SOLN
INTRAVENOUS | Status: DC | PRN
  Administered 2015-06-11: 0.7 ug/kg/h via INTRAVENOUS

## 2015-06-11 MED ORDER — SODIUM CHLORIDE 0.9 % IV SOLN
INTRAVENOUS | Status: DC | PRN
  Administered 2015-06-11: 17:00:00 via INTRAVENOUS

## 2015-06-11 MED ORDER — ROCURONIUM BROMIDE 100 MG/10ML IV SOLN
INTRAVENOUS | Status: DC | PRN
  Administered 2015-06-11 (×2): 50 mg via INTRAVENOUS
  Administered 2015-06-11: 30 mg via INTRAVENOUS
  Administered 2015-06-11: 50 mg via INTRAVENOUS

## 2015-06-11 MED ORDER — SODIUM CHLORIDE 0.9 % IV SOLN
INTRAVENOUS | Status: DC | PRN
  Administered 2015-06-11: 17:00:00

## 2015-06-11 MED ORDER — ACETAMINOPHEN 325 MG RE SUPP: 325.0000 mg | RECTAL | Status: DC | PRN

## 2015-06-11 MED ORDER — CALCIUM CHLORIDE 10 % IV SOLN
INTRAVENOUS | Status: AC
  Filled 2015-06-11: qty 20

## 2015-06-11 MED ORDER — PHENYLEPHRINE HCL 10 MG/ML IJ SOLN: 10.0000 mg | INTRAVENOUS | Status: DC | PRN

## 2015-06-11 MED ORDER — ALBUMIN HUMAN 5 % IV SOLN
12.5000 g | Freq: Once | INTRAVENOUS | Status: AC
Start: 2015-06-11 — End: 2015-06-11
  Administered 2015-06-11: 12.5 g via INTRAVENOUS

## 2015-06-11 MED ORDER — DEXTROSE 5 % IV SOLN
1.5000 g | Freq: Two times a day (BID) | INTRAVENOUS | Status: DC
  Administered 2015-06-12: 1.5 g via INTRAVENOUS
  Filled 2015-06-11 (×2): qty 1.5

## 2015-06-11 MED ORDER — SODIUM CHLORIDE 0.9 % IV SOLN: 500.0000 mL | Freq: Once | INTRAVENOUS | Status: DC | PRN

## 2015-06-11 MED ORDER — LIDOCAINE HCL (CARDIAC) 20 MG/ML IV SOLN
INTRAVENOUS | Status: AC
  Filled 2015-06-11: qty 5

## 2015-06-11 MED ORDER — DEXMEDETOMIDINE HCL IN NACL 200 MCG/50ML IV SOLN
0.4000 ug/kg/h | INTRAVENOUS | Status: DC
  Filled 2015-06-11: qty 50

## 2015-06-11 MED ORDER — NOREPINEPHRINE BITARTRATE 1 MG/ML IV SOLN
0.0000 ug/min | INTRAVENOUS | Status: DC
  Administered 2015-06-12 (×2): 30 ug/min via INTRAVENOUS
  Filled 2015-06-11 (×3): qty 16

## 2015-06-11 MED ORDER — DEXMEDETOMIDINE HCL IN NACL 200 MCG/50ML IV SOLN
0.0000 ug/kg/h | INTRAVENOUS | Status: DC
  Administered 2015-06-11: 0.5 ug/kg/h via INTRAVENOUS
  Administered 2015-06-12: 0.7 ug/kg/h via INTRAVENOUS
  Administered 2015-06-12 (×2): 1.2 ug/kg/h via INTRAVENOUS
  Filled 2015-06-11 (×4): qty 50

## 2015-06-11 MED ORDER — HEPARIN SODIUM (PORCINE) 1000 UNIT/ML IJ SOLN
INTRAMUSCULAR | Status: DC | PRN
  Administered 2015-06-11: 6000 [IU] via INTRAVENOUS

## 2015-06-11 MED ORDER — ACETAMINOPHEN 325 MG PO TABS: 325.0000 mg | ORAL_TABLET | ORAL | Status: DC | PRN

## 2015-06-11 MED ORDER — PHENYLEPHRINE HCL 10 MG/ML IJ SOLN
10.0000 mg | INTRAVENOUS | Status: DC | PRN
  Administered 2015-06-11: 100 ug/min via INTRAVENOUS

## 2015-06-11 MED ORDER — INSULIN ASPART 100 UNIT/ML ~~LOC~~ SOLN
SUBCUTANEOUS | Status: DC | PRN
  Administered 2015-06-11: 10 [IU] via SUBCUTANEOUS

## 2015-06-11 MED ORDER — PHENYLEPHRINE 40 MCG/ML (10ML) SYRINGE FOR IV PUSH (FOR BLOOD PRESSURE SUPPORT)
PREFILLED_SYRINGE | INTRAVENOUS | Status: AC
  Filled 2015-06-11: qty 10

## 2015-06-11 MED ORDER — SUCCINYLCHOLINE CHLORIDE 20 MG/ML IJ SOLN
INTRAMUSCULAR | Status: DC | PRN
  Administered 2015-06-11: 100 mg via INTRAVENOUS

## 2015-06-11 MED ORDER — HEMOSTATIC AGENTS (NO CHARGE) OPTIME
TOPICAL | Status: DC | PRN
  Administered 2015-06-11: 1 via TOPICAL

## 2015-06-11 MED ORDER — SUFENTANIL CITRATE 50 MCG/ML IV SOLN
INTRAVENOUS | Status: AC
  Filled 2015-06-11: qty 1

## 2015-06-11 MED ORDER — OXYCODONE-ACETAMINOPHEN 5-325 MG PO TABS
ORAL_TABLET | ORAL | Status: AC
  Filled 2015-06-11: qty 1

## 2015-06-11 MED ORDER — FENTANYL CITRATE (PF) 100 MCG/2ML IJ SOLN: 50.0000 ug | INTRAMUSCULAR | Status: DC | PRN

## 2015-06-11 MED ORDER — THROMBIN 20000 UNITS EX SOLR
CUTANEOUS | Status: AC
  Filled 2015-06-11: qty 20000

## 2015-06-11 MED ORDER — ANTISEPTIC ORAL RINSE SOLUTION (CORINZ)
7.0000 mL | Freq: Four times a day (QID) | OROMUCOSAL | Status: DC
  Administered 2015-06-11: 7 mL via OROMUCOSAL

## 2015-06-11 MED ORDER — LACTATED RINGERS IV SOLN
INTRAVENOUS | Status: DC | PRN
  Administered 2015-06-11 (×2): via INTRAVENOUS

## 2015-06-11 MED ORDER — IOHEXOL 350 MG/ML SOLN
100.0000 mL | Freq: Once | INTRAVENOUS | Status: AC | PRN
  Administered 2015-06-11: 100 mL via INTRAVENOUS

## 2015-06-11 SURGICAL SUPPLY — 49 items
CANISTER SUCTION 2500CC (MISCELLANEOUS) ×3 IMPLANT
CANNULA VESSEL 3MM 2 BLNT TIP (CANNULA) ×7 IMPLANT
CATH EMB 4FR 80CM (CATHETERS) ×2 IMPLANT
CLIP TI MEDIUM 24 (CLIP) ×3 IMPLANT
CLIP TI WIDE RED SMALL 24 (CLIP) ×3 IMPLANT
DRSG COVADERM 4X14 (GAUZE/BANDAGES/DRESSINGS) ×2 IMPLANT
ELECT BLADE 4.0 EZ CLEAN MEGAD (MISCELLANEOUS) ×6
ELECT BLADE 6.5 EXT (BLADE) IMPLANT
ELECT REM PT RETURN 9FT ADLT (ELECTROSURGICAL) ×3
ELECTRODE BLDE 4.0 EZ CLN MEGD (MISCELLANEOUS) ×1 IMPLANT
ELECTRODE REM PT RTRN 9FT ADLT (ELECTROSURGICAL) ×1 IMPLANT
GLOVE BIO SURGEON STRL SZ7.5 (GLOVE) ×9 IMPLANT
GLOVE BIOGEL PI IND STRL 6.5 (GLOVE) IMPLANT
GLOVE BIOGEL PI IND STRL 8 (GLOVE) ×1 IMPLANT
GLOVE BIOGEL PI INDICATOR 6.5 (GLOVE) ×2
GLOVE BIOGEL PI INDICATOR 8 (GLOVE) ×6
GOWN STRL REUS W/ TWL LRG LVL3 (GOWN DISPOSABLE) ×3 IMPLANT
GOWN STRL REUS W/TWL LRG LVL3 (GOWN DISPOSABLE) ×15
GRAFT HEMASHIELD 16MM (Vascular Products) ×2 IMPLANT
INSERT FOGARTY 61MM (MISCELLANEOUS) ×8 IMPLANT
INSERT FOGARTY SM (MISCELLANEOUS) ×12 IMPLANT
KIT BASIN OR (CUSTOM PROCEDURE TRAY) ×3 IMPLANT
KIT ROOM TURNOVER OR (KITS) ×3 IMPLANT
NS IRRIG 1000ML POUR BTL (IV SOLUTION) ×6 IMPLANT
PACK AORTA (CUSTOM PROCEDURE TRAY) ×3 IMPLANT
PAD ARMBOARD 7.5X6 YLW CONV (MISCELLANEOUS) ×6 IMPLANT
RETAINER VISCERA MED (MISCELLANEOUS) ×5 IMPLANT
SPONGE LAP 18X18 X RAY DECT (DISPOSABLE) ×6 IMPLANT
SPONGE SURGIFOAM ABS GEL 100 (HEMOSTASIS) IMPLANT
STAPLER VISISTAT (STAPLE) ×6 IMPLANT
STAPLER VISISTAT 35W (STAPLE) ×4 IMPLANT
SUT ETHIBOND 5 LR DA (SUTURE) ×4 IMPLANT
SUT PDS AB 1 TP1 54 (SUTURE) ×10 IMPLANT
SUT PROLENE 2 0 MH 48 (SUTURE) IMPLANT
SUT PROLENE 3 0 SH 48 (SUTURE) IMPLANT
SUT PROLENE 3 0 SH DA (SUTURE) ×12 IMPLANT
SUT PROLENE 3 0 SH1 36 (SUTURE) IMPLANT
SUT PROLENE 4 0 RB 1 (SUTURE) ×9
SUT PROLENE 4-0 RB1 .5 CRCL 36 (SUTURE) IMPLANT
SUT PROLENE 5 0 C 1 24 (SUTURE) ×2 IMPLANT
SUT PROLENE 5 0 C 1 36 (SUTURE) IMPLANT
SUT SILK 2 0 SH CR/8 (SUTURE) ×5 IMPLANT
SUT VIC AB 2-0 CTB1 (SUTURE) ×10 IMPLANT
SUT VIC AB 3-0 SH 27 (SUTURE) ×6
SUT VIC AB 3-0 SH 27X BRD (SUTURE) IMPLANT
SYR 3ML LL SCALE MARK (SYRINGE) ×4 IMPLANT
TOWEL BLUE STERILE X RAY DET (MISCELLANEOUS) ×6 IMPLANT
TRAY FOLEY W/METER SILVER 16FR (SET/KITS/TRAYS/PACK) ×3 IMPLANT
WATER STERILE IRR 1000ML POUR (IV SOLUTION) ×6 IMPLANT

## 2015-06-11 NOTE — ED Notes (Signed)
In CT at this time

## 2015-06-11 NOTE — Addendum Note (Signed)
Addendum  created 06/06/2015 2355 by Valetta Fuller, CRNA   Modules edited: Anesthesia Attestations

## 2015-06-11 NOTE — Anesthesia Postprocedure Evaluation (Signed)
Anesthesia Post Note  Patient: Manuel Armstrong  Procedure(s) Performed: Procedure(s) (LRB): ANEURYSM ABDOMINAL AORTIC REPAIR (N/A)  Patient location during evaluation: SICU Anesthesia Type: General Level of consciousness: sedated Pain management: pain level controlled Vital Signs Assessment: post-procedure vital signs reviewed and stable Respiratory status: patient remains intubated per anesthesia plan Cardiovascular status: unstable Anesthetic complications: no    Last Vitals:  Filed Vitals:   06/09/2015 2145 05/30/2015 2148  BP: 69/54   Pulse:    Temp: 32.7 C 32.8 C  Resp: 12 12    Last Pain:  Filed Vitals:   05/26/2015 2149  PainSc: 8                  Stella Bortle,W. EDMOND

## 2015-06-11 NOTE — ED Notes (Signed)
Pt's daughter-in-law requested water for pt and states he felt like Percocet was stuck in throat.  EMT went to take pt water and pt was sitting and starring straight.  Pt looked to right, R arm raised up, pt slumped in chair, moaned, and had approx 15 seconds of seizure like activity.  Pt incontinent of urine.  Pt appears postictal- no seizure history per family.  Taken straight to trauma room.  Pt able to state name once in trauma room.  EDP notified of same.

## 2015-06-11 NOTE — ED Notes (Signed)
Pt c/o abdominal pain with nausea and dry heaves in triage onset at 1100 today. Pt denies change in diet.

## 2015-06-11 NOTE — ED Notes (Signed)
Emergency blood started by Warren Lacy, RN

## 2015-06-11 NOTE — Anesthesia Procedure Notes (Addendum)
Central Venous Catheter Insertion Performed by: anesthesiologist 06/17/2015 4:45 PM Patient location: OR. Emergency situation Preanesthetic checklist: patient identified, IV checked, site marked, risks and benefits discussed, surgical consent, monitors and equipment checked, pre-op evaluation, timeout performed and anesthesia consent Position: Trendelenburg Lidocaine 1% used for infiltration Landmarks identified and Seldinger technique used Catheter size: 9 Fr Central line was placed.MAC introducer Procedure performed using ultrasound guided technique. Attempts: 1 Following insertion, line sutured and dressing applied. Post procedure assessment: blood return through all ports, free fluid flow and no air. Patient tolerated the procedure well with no immediate complications.

## 2015-06-11 NOTE — Anesthesia Preprocedure Evaluation (Addendum)
Anesthesia Evaluation  Patient identified by MRN, date of birth, ID band Patient awake    Reviewed: Allergy & Precautions, H&P , NPO status , Patient's Chart, lab work & pertinent test results, reviewed documented beta blocker date and time   Airway Mallampati: III  TM Distance: >3 FB Neck ROM: Full    Dental no notable dental hx. (+) Teeth Intact, Dental Advisory Given   Pulmonary neg pulmonary ROS, former smoker,    Pulmonary exam normal breath sounds clear to auscultation       Cardiovascular hypertension, Pt. on medications and Pt. on home beta blockers + dysrhythmias Atrial Fibrillation  Rhythm:Irregular Rate:Normal     Neuro/Psych CVA negative psych ROS   GI/Hepatic negative GI ROS, Neg liver ROS,   Endo/Other  negative endocrine ROS  Renal/GU negative Renal ROS  negative genitourinary   Musculoskeletal   Abdominal   Peds  Hematology negative hematology ROS (+)   Anesthesia Other Findings   Reproductive/Obstetrics negative OB ROS                            Anesthesia Physical Anesthesia Plan  ASA: IV and emergent  Anesthesia Plan: General   Post-op Pain Management:    Induction: Intravenous, Rapid sequence and Cricoid pressure planned  Airway Management Planned: Oral ETT  Additional Equipment: Arterial line and CVP  Intra-op Plan:   Post-operative Plan: Post-operative intubation/ventilation  Informed Consent: I have reviewed the patients History and Physical, chart, labs and discussed the procedure including the risks, benefits and alternatives for the proposed anesthesia with the patient or authorized representative who has indicated his/her understanding and acceptance.   Dental advisory given  Plan Discussed with: CRNA  Anesthesia Plan Comments:        Anesthesia Quick Evaluation

## 2015-06-11 NOTE — ED Provider Notes (Signed)
CSN: [id number]     Arrival date & time 06/09/2015  1331 History   First MD Initiated Contact with Patient 05/31/2015 1524     Chief Complaint  Patient presents with  . Abdominal Pain  . Nausea  . Emesis     (Consider location/radiation/quality/duration/timing/severity/associated sxs/prior Treatment) HPI Comments: 82 year old male with history of stroke, high blood pressure, lipids, Eliquis use presents with severe abdominal pain going to the back. Initially started mid back pain and then evolved into left mid abdominal pain rating to the back. No history of similar. Severe. No known aneurysm. No blood in the stools. Patient waiting in triage and had a brief seizure/syncope episode. No history of either of those. Pain constant.  Patient is a 82 y.o. male presenting with abdominal pain and vomiting. The history is provided by the patient, medical records and a relative.  Abdominal Pain Associated symptoms: nausea and vomiting   Associated symptoms: no chest pain, no chills, no dysuria, no fever and no shortness of breath   Emesis Associated symptoms: abdominal pain   Associated symptoms: no chills and no headaches     Past Medical History  Diagnosis Date  . Hypertension   . Cancer [facility])    Past Surgical History  Procedure Laterality Date  . Prostate cancer    . Bladder surgery     Family History  Problem Relation Age of Onset  . Hypertension Mother   . Stroke Mother   . Lung cancer Brother    Social History  Substance Use Topics  . Smoking status: Former Research scientist (life sciences)  . Smokeless tobacco: Never Used  . Alcohol Use: 0.0 oz/week    0 Standard drinks or equivalent per week     Comment: 2-3 beers a day, all his life    Review of Systems  Constitutional: Positive for appetite change. Negative for fever and chills.  HENT: Negative for congestion.   Eyes: Negative for visual disturbance.  Respiratory: Negative for shortness of breath.   Cardiovascular: Negative for chest pain.   Gastrointestinal: Positive for nausea, vomiting and abdominal pain.  Genitourinary: Negative for dysuria and flank pain.  Musculoskeletal: Positive for back pain. Negative for neck pain and neck stiffness.  Skin: Negative for rash.  Neurological: Positive for syncope and light-headedness. Negative for headaches.      Allergies  Review of patient's allergies indicates no known allergies.  Home Medications   Prior to Admission medications   Medication Sig Start Date End Date Taking? Authorizing Provider  BESIVANCE 0.6 % SUSP Place 1 drop into the right eye 4 (four) times daily. 06/06/14   Historical Provider, MD  beta carotene w/minerals (OCUVITE) tablet Take 2 tablets by mouth daily.    Historical Provider, MD  ELIQUIS 5 MG TABS tablet TAKE 1 TABLET BY MOUTH TWICE A DAY 06/23/14   Rosalin Hawking, MD  ferrous fumarate (HEMOCYTE - 106 MG FE) 325 (106 FE) MG TABS tablet Take 1 tablet by mouth daily.    Historical Provider, MD  metoprolol succinate (TOPROL-XL) 50 MG 24 hr tablet TAKE 1 TABLET BY MOUTH DAILY. TAKE WITH OR IMMEDIATELY FOLLOWING A MEAL 12/26/14   Rosalin Hawking, MD  Multiple Vitamins-Minerals (MULTIVITAMINS) CHEW Chew 1 tablet by mouth daily.    Historical Provider, MD  simvastatin (ZOCOR) 10 MG tablet Take 1 tablet (10 mg total) by mouth at bedtime. 07/28/13   Ripudeep K Rai, MD   BP 85/65 mmHg  Pulse 78  Temp(Src) 97.6 F (36.4 C) (Oral)  Resp 21  SpO2 100% Physical Exam  Constitutional: He is oriented to person, place, and time. He appears well-developed and well-nourished.  HENT:  Head: Normocephalic and atraumatic.  Eyes: Conjunctivae are normal. Right eye exhibits no discharge. Left eye exhibits no discharge.  Neck: Normal range of motion. Neck supple. No tracheal deviation present.  Cardiovascular: Normal rate and regular rhythm.   Decreased pulses in all extremities  Pulmonary/Chest: Effort normal and breath sounds normal.  Abdominal: Soft. He exhibits no distension.  There is tenderness (moderate anterior, aneurysm palpated). There is no guarding.  Musculoskeletal: He exhibits no edema.  Neurological: He is alert and oriented to person, place, and time.  Skin: No rash noted. No erythema.  Psychiatric: He has a normal mood and affect.  Nursing note and vitals reviewed.   ED Course  Procedures (including critical care time) CRITICAL CARE Performed by: Mariea Clonts  Total critical care time: 40 minutes  Critical care time was exclusive of separately billable procedures and treating other patients.  Critical care was necessary to treat or prevent imminent or life-threatening deterioration.  Critical care was time spent personally by me on the following activities: development of treatment plan with patient and/or surrogate as well as nursing, discussions with consultants, evaluation of patient's response to treatment, examination of patient, obtaining history from patient or surrogate, ordering and performing treatments and interventions, ordering and review of laboratory studies, ordering and review of radiographic studies, pulse oximetry and re-evaluation of patient's condition.  EMERGENCY DEPARTMENT ULTRASOUND  Study: Limited Retroperitoneal Ultrasound of the Abdominal Aorta.  INDICATIONS:Hypotension, Abdominal pain and Back pain Multiple views of the abdominal aorta were obtained in real-time from the diaphragmatic hiatus to the aortic bifurcation in transverse planes with a multi-frequency probe. PERFORMED BY: Myself IMAGES ARCHIVED?: Yes FINDINGS: Maximum aortic dimensions are 5 LIMITATIONS:  Abdominal pain INTERPRETATION:  Abdominal aortic aneurysm present   CPT Code: [phone] (limited retroperitoneal)   Labs Review Labs Reviewed  COMPREHENSIVE METABOLIC PANEL - Abnormal; Notable for the following:    Chloride 98 (*)    Glucose, Bld 128 (*)    BUN 30 (*)    Creatinine, Ser 1.35 (*)    GFR calc non Af Amer 43 (*)    GFR calc Af  Amer 50 (*)    All other components within normal limits  CBC - Abnormal; Notable for the following:    RBC 4.16 (*)    Hemoglobin 12.3 (*)    HCT 38.1 (*)    All other components within normal limits  URINALYSIS, ROUTINE W REFLEX MICROSCOPIC (NOT AT [facility]) - Abnormal; Notable for the following:    Ketones, ur 15 (*)    Protein, ur 30 (*)    All other components within normal limits  URINE MICROSCOPIC-ADD ON - Abnormal; Notable for the following:    Squamous Epithelial / LPF 0-5 (*)    All other components within normal limits  LIPASE, BLOOD  PROTIME-INR  APTT  PREPARE FRESH FROZEN PLASMA  TYPE AND SCREEN  ABO/RH  PREPARE RBC (CROSSMATCH)  PREPARE FRESH FROZEN PLASMA    Imaging Review No results found. I have personally reviewed and evaluated these images and lab results as part of my medical decision-making.   EKG Interpretation   Date/Time:  Sunday June 11 2015 15:56:18 EDT Ventricular Rate:  87 PR Interval:    QRS Duration: 114 QT Interval:  400 QTC Calculation: 481 R Axis:   -61 Text Interpretation:  Atrial fibrillation Left anterior fascicular block  Anteroseptal  infarct, age indeterminate Baseline wander in lead(s) V4  Confirmed by Dynastie Knoop  MD, Gervase Colberg ([id number]) on 06/18/2015 4:22:31 PM      MDM   Final diagnoses:  Abdominal aortic aneurysm (AAA) >39 mm diameter (HCC)  Abdominal pain, left lateral  Hypotension, unspecified hypotension type   Patient presents in triage after presenting with severe abdominal pain and having syncope/seizure episode. Bedside ultrasound confirmed 5 cm aneurysm concern for rupture/leak. Paged past for surgery immediately, patient sent to CT scan. I evaluated the patient discussed the case with [place] surgery in CT scan. Plan for operating room. Patient is a very functional 82 year old male and clarified with him he would like full aggressive care unless his heart stops prior to surgery.  Family aware. Patient with severity of  presentation. Emergent blood ordered and started in the ER.  The patients results and plan were reviewed and discussed.   Any x-rays performed were independently reviewed by myself.   Differential diagnosis were considered with the presenting HPI.  Medications  ondansetron (ZOFRAN-ODT) disintegrating tablet 4 mg (4 mg Oral Given 06/13/2015 1403)  oxyCODONE-acetaminophen (PERCOCET/ROXICET) 5-325 MG per tablet 1 tablet (1 tablet Oral Given 06/19/2015 1451)  iohexol (OMNIPAQUE) 350 MG/ML injection 100 mL (100 mLs Intravenous Contrast Given 06/03/2015 1545)    Filed Vitals:   06/03/2015 1533 05/29/2015 1545 06/17/2015 1600 05/25/2015 1615  BP: 78/60 83/66 74/63  85/65  Pulse:  84 84 78  Temp:      TempSrc:      Resp:  24 29 21   SpO2:  97% 95% 100%    Final diagnoses:  Abdominal aortic aneurysm (AAA) >39 mm diameter (HCC)  Abdominal pain, left lateral  Hypotension, unspecified hypotension type    Admission/ observation were discussed with the admitting physician, patient and/or family and they are comfortable with the plan.     Elnora Morrison, MD 05/26/2015 [phone]

## 2015-06-11 NOTE — Consult Note (Signed)
PULMONARY / CRITICAL CARE MEDICINE   Name: Manuel Armstrong MRN: [id number] DOB: Jul 28, 1919    ADMISSION DATE:  06/15/2015 CONSULTATION DATE:  06/22/2015  REFERRING MD:  Rosalia Hammers  CHIEF COMPLAINT:  Ruptured abdominal aortic aneurysm  HISTORY OF PRESENT ILLNESS:   Manuel Armstrong is a 61M who presented to the ED with complaints of acute onset abdominal pain. On arrival to the ED he had some seizure like activity and initial vitals revealed significant hypotension. CT abdomen showed a ruptured AAA with retroperitoneal hematoma. There was also an incidental finding off a large left renal abnromality concerning for mass vs hematoma. The patient was taken emergently to the OR for an open approach at repair of the ruptured AAA. Of note, he had been on Eliquis for atrial fibrillation. Intraoperatively a large retroperitoneal hematoma was evacuated. A graft was placed. The patient required aggressive resuscitation and received 7u pRBC, 10u FFP, 3 PLT as well as cryo. He was also given back 1925cc from [facility].   On arrival to the ICU he is intubated, sedated (on Precedex), and requiring norepinephrine and phenylephrine.   Per the notes, the surgeon had a discussion with the family about the critical nature of these events and they stated that he would not want prolonged life support. Unfortunately, we were unable to locate them after his arrival to ICU. His baseline function prior to these events is unclear from the notes.    PAST MEDICAL HISTORY :  He  has a past medical history of Hypertension and Cancer ([place]).  PAST SURGICAL HISTORY: He  has past surgical history that includes prostate cancer and Bladder surgery.  No Known Allergies  No current facility-administered medications on file prior to encounter.   Current Outpatient Prescriptions on File Prior to Encounter  Medication Sig  . BESIVANCE 0.6 % SUSP Place 1 drop into the right eye 4 (four) times daily.  . beta carotene w/minerals  (OCUVITE) tablet Take 2 tablets by mouth daily.  Marland Kitchen ELIQUIS 5 MG TABS tablet TAKE 1 TABLET BY MOUTH TWICE A DAY  . ferrous fumarate (HEMOCYTE - 106 MG FE) 325 (106 FE) MG TABS tablet Take 1 tablet by mouth daily.  . metoprolol succinate (TOPROL-XL) 50 MG 24 hr tablet TAKE 1 TABLET BY MOUTH DAILY. TAKE WITH OR IMMEDIATELY FOLLOWING A MEAL  . Multiple Vitamins-Minerals (MULTIVITAMINS) CHEW Chew 1 tablet by mouth daily.  . simvastatin (ZOCOR) 10 MG tablet Take 1 tablet (10 mg total) by mouth at bedtime.    FAMILY HISTORY:  His indicated that his mother is deceased. He indicated that his father is deceased. He indicated that his brother is deceased.   SOCIAL HISTORY: He  reports that he has quit smoking. He has never used smokeless tobacco. He reports that he drinks alcohol. He reports that he does not use illicit drugs.  REVIEW OF SYSTEMS:   Unable to obtain 2/2 intubation / sedation  SUBJECTIVE:    VITAL SIGNS: BP 85/65 mmHg  Pulse 78  Temp(Src) 97.6 F (36.4 C) (Oral)  Resp 21  Ht 6' (1.829 m)  SpO2 99%  HEMODYNAMICS:    VENTILATOR SETTINGS: Vent Mode:  [-] SIMV;PRVC;PSV FiO2 (%):  [50 %] 50 % Set Rate:  [12 bmp] 12 bmp Vt Set:  RW:212346 mL] 620 mL PEEP:  [5 cmH20] 5 cmH20 Plateau Pressure:  [29 cmH20] 29 cmH20  INTAKE / OUTPUT: I/O last 3 completed shifts: In: 8276 [I.V.:2400; Blood:5876] Out: 60 [Urine:60]  PHYSICAL EXAMINATION: Physical Exam: Temp:  [  97.6 F (36.4 C)] 97.6 F (36.4 C) (03/19 1352) Pulse Rate:  [78-95] 78 (03/19 1615) Resp:  [20-29] 21 (03/19 1615) BP: (74-165)/(60-93) 85/65 mmHg (03/19 1615) SpO2:  [95 %-100 %] 99 % (03/19 2049) FiO2 (%):  [50 %] 50 % (03/19 2049) Weight:  [68.1 kg (150 lb 2.1 oz)] 68.1 kg (150 lb 2.1 oz) (03/19 2100)  General Well nourished, well developed, intubated, sedated  HEENT No gross abnormalities. Oropharynx difficult to assess 2/2 ETT, OGT. Pupils equal, reactive.   Pulmonary Clear to auscultation bilaterally with no  wheezes, rales or ronchi. Good effort, symmetrical expansion.   Cardiovascular Normal rate, irregularly irregular rhythm. S1, s2. No m/r/g. Radial pulses palpable, DPs dopplerable  Abdomen Distended, absent bowel sounds, no palpable organomegaly or masses. Midline dressing in place with minimal bleeding noted.  Musculoskeletal No bony abnormalities  Lymphatics No cervical, supraclavicular or axillary adenopathy.   Neurologic Sedated.   Skin/Integuement No rash, no clubbing. Bilateral feet with cyanosis and discoloration of plantar surface and lateral aspect of foot as well as toes.      LABS:  BMET  Recent Labs Lab 05/25/2015 1444  06/01/2015 1831 06/20/2015 1907 06/16/2015 1912  NA 136  < > 134* 136 137  K 4.7  < > 5.3* 4.4 4.4  CL 98*  --   --   --   --   CO2 23  --   --   --   --   BUN 30*  --   --   --   --   CREATININE 1.35*  --   --   --   --   GLUCOSE 128*  --  267*  --  256*  < > = values in this interval not displayed.  Electrolytes  Recent Labs Lab 05/26/2015 1444  CALCIUM 9.5    CBC  Recent Labs Lab 06/19/2015 1444  05/25/2015 1907 06/07/2015 1912 06/15/2015 1922  WBC 9.2  --   --   --   --   HGB 12.3*  < > 9.5* 9.9* 10.1*  HCT 38.1*  < > 28.0* 29.0* 31.3*  PLT 195  --   --   --  44*  44*  < > = values in this interval not displayed.  Coag's  Recent Labs Lab 06/01/2015 1608 06/13/2015 1922  APTT 36 47*  INR 1.79* 2.15*    Sepsis Markers No results for input(s): LATICACIDVEN, PROCALCITON, O2SATVEN in the last 168 hours.  ABG  Recent Labs Lab 06/10/2015 1749 06/17/2015 1821 05/30/2015 1907  PHART 7.394 7.308* 7.399  PCO2ART 30.5* 37.7 30.0*  PO2ART 551.0* 448.0* 477.0*    Liver Enzymes  Recent Labs Lab 06/08/2015 1444  AST 32  ALT 20  ALKPHOS 83  BILITOT 1.2  ALBUMIN 3.9    Cardiac Enzymes No results for input(s): TROPONINI, PROBNP in the last 168 hours.  Glucose  Recent Labs Lab 05/26/2015 1532  GLUCAP 136*    Imaging Ct Angio Chest Aorta  W/cm &/or Wo/cm  06/11/2015  CLINICAL DATA:  By report the patient collapsed in the emergency department waiting room, abdominal aortic aneurysm seen on stat ER ultrasound, concern for leak EXAM: CT ANGIOGRAPHY CHEST, ABDOMEN AND PELVIS TECHNIQUE: Multidetector CT imaging through the chest, abdomen and pelvis was performed using the standard protocol during bolus administration of intravenous contrast. Multiplanar reconstructed images and MIPs were obtained and reviewed to evaluate the vascular anatomy. CONTRAST:  176mL OMNIPAQUE IOHEXOL 350 MG/ML SOLN COMPARISON:  None. FINDINGS: CTA CHEST  FINDINGS Mediastinum/Nodes: Coronary, aortic arch, and branch vessel atherosclerotic vascular disease. Mild ascending aortic aneurysm, 4 cm diameter, although the main pulmonary artery trunk is 4.2 cm diameter. I do not see signs of acute intramural hematoma on the noncontrast images. On the arterial phase contrast images, there is a penetrating ulcer anteriorly shown on images 87 through 94 of series 503, likely chronic given the calcification along its margins, and not actively bleeding at this time. The no large pulmonary embolus identified. Scattered mediastinal lymph nodes are observed including a 9 mm left paratracheal node (image 51 series 501) and a 1.3 cm right hilar node (image 64, series 501). Four-chamber cardiomegaly. No pericardial effusion. Calcified left infrahilar lymph nodes compatible with old granulomatous disease. Calcified granuloma in the left lower lobe on image 35 series 5 and 6. Lungs/Pleura: Small right pleural effusion, nonspecific for transudative versus exudative etiology. Markedly severe emphysema. Biapical pleural parenchymal scarring. Pleural-based 0.8 by 0.9 cm right upper lobe pulmonary nodule, image 25 series 506, not calcified. 4 mm left lower lobe nodule, image 34 series 506. Musculoskeletal: Mild grade 1 anterolisthesis at T11-12. Review of the MIP images confirms the above findings. CTA  ABDOMEN AND PELVIS FINDINGS Hepatobiliary: Are early enhancement of the left hepatic lobe compared to most of the right hepatic lobe, thought to be due to the early contrast phase. Pancreas: Unremarkable Spleen: Numerous calcified nodules compatible with old granulomatous disease. Adrenals/Urinary Tract: 10.7 by 7.8 cm hyperdense and mildly heterogeneous mass extending from the left kidney lower pole. Adjacent left anterior renal infarct. Left ureter completely obscured. I do not see definite active extravasation from the kidney. Stomach/Bowel: Unremarkable Vascular/Lymphatic: 5.1 by 5.3 cm fusiform infrarenal abdominal aortic aneurysm with acute rupture along the left lateral site, and active bleeding and contrast extravasation into the retroperitoneum shown on image 190 series 501. Very large retroperitoneal hematoma tracking especially on the left side of foot from the level of the hemidiaphragm down nearly to the inguinal canal. The aneurysm is 7.4 cm in length and begins 3.4 cm below the lowest renal artery which is the left renal artery. The celiac trunk and its branches are patent. The SMA is patent. Both renal arteries are patent the aneurysm extends essentially to the bifurcation. No propagating dissection identified. Reproductive: Curvilinear calcification in the central zone of the prostate gland. Other: No supplemental non-categorized findings. Musculoskeletal: Lumbar spondylosis and degenerative disc disease. Review of the MIP images confirms the above findings. IMPRESSION: 1. Ruptured abdominal aortic aneurysm actively bleeding into the retroperitoneum with a large retroperitoneal hematoma. The bleed is along the left side of the aneurysm. The aneurysm starts 3.4 cm below the lowest (left) renal artery and extends over 7.4 cm basically to the bifurcation. 2. In addition, part of the left kidney has been infarcted and there is a very large hyperdense mass like lesion extending down from the kidney,  probably a hematoma in this situation rather than a renal cell carcinoma. Although I do not see definite active bleeding is from the left kidney, there is a hyperdense rim to the large hematoma which could reflect some low-level bleeding from the left kidney. 3. Chronic appearing ulceration along the anterior aortic margin at the hiatus, not actively bleeding at this time. 4. Minimal ascending aortic aneurysm at 4 cm, although the main pulmonary arterial trunk is similar in size. 5. 8 by 9 mm right upper lobe pulmonary nodule, and 4 mm left lower lobe pulmonary nodule. Once the patient is fully stable last, consider  CT, PET-CT, or tissue sampling in 3 months. 6. Other imaging findings of potential clinical significance: Coronary, aortic arch, and branch vessel atherosclerotic vascular disease. Aortoiliac atherosclerotic vascular disease. Small right pleural effusion. Severe emphysema. Critical Value/emergent results were called by telephone at the time of interpretation on 06/05/2015 at 4:35 pm to Dr. Elnora Morrison , who verbally acknowledged these results. That was basically right when I started looking at the case, because of the severely emergent findings. The patient was already in the operating room. I also telephoned Dr. Scot Dock at 4:54 p.m. in the operating room to make certain he was aware of the left renal findings in addition to the aortic findings -I relayed those by speaker phone. Electronically Signed   By: Van Clines M.D.   On: 06/18/2015 17:00   Ct Angio Abd/pel W/ And/or W/o  06/01/2015  CLINICAL DATA:  PT. COLLAPSED IN ED WAITING, SEIZURE, DR. ZAVITZ PERFORMED US AND SAW AAA, PT. SEND TO CT1 WITHOUT LABS, DR. DICKSON APPROVED NO LABS NEEDED IN URGENT SITUATION, 100 ML OMNI 350, LW^17mL OMNIPAQUE IOHEXOL 350 MG/ML SOLN EXAM: CT ANGIOGRAPHY CHEST, ABDOMEN AND PELVIS TECHNIQUE: Multidetector CT imaging through the chest, abdomen and pelvis was performed using the standard protocol during  bolus administration of intravenous contrast. Multiplanar reconstructed images and MIPs were obtained and reviewed to evaluate the vascular anatomy. CONTRAST:  16mL OMNIPAQUE IOHEXOL 350 MG/ML SOLN COMPARISON:  CT 03/28/2005 FINDINGS: CT CHEST FINDINGS Heart: Heart is mildly enlarged. Coronary artery calcifications are present. No significant pericardial effusion. Vascular structures: Thoracic aorta is calcified about evidence for a aneurysm. At the level of the hemidiaphragm there is a atherosclerotic penetrating ulcer, best seen on image 92 of series 55. Great vessels are normally opacified. Mediastinum/thyroid: Insert thyroid E small, nonspecific mediastinal lymph nodes are identified, measuring less than 1 cm in short dimension. Esophagus twist all nausea and hearing and he Lungs/Airways: Emphysematous changes are present. There is a 10 x 9 mm nodule (9 mm mean diameter) on image 26 of series 506. A calcified nodule is identified at the left lung base. A 3 mm nodule is identified at the left lung base on image 34 of series 506. No focal consolidations are identified. Right pleural effusion is present. Chest wall/osseous:  Unremarkable. CT ABDOMEN AND PELVIS FINDINGS Upper abdomen: Perfusion defects are identified within the level. No focal abnormality identified. Numerous splenic granulomata are present. The right adrenal gland is normal. Left adrenal gland is obscured by hematoma. See below. Gallbladder is present. Right kidney has a normal appearance. Left kidney is anteriorly displaced by and large retroperitoneal hematoma. Along the and very are old left kidney there is a large solid mass in measuring 7.6 x 10.2 x 7.8 cm. There is heterogeneous density within the mass, findings are suspicious for renal cell carcinoma. New Gastrointestinal tract: The stomach and small bowel loops are normal in configuration although displaced by retroperitoneal hematoma. The appendix is well seen and has a normal appearance.  Colonic loops contain numerous diverticula no evidence for acute diverticulitis. Colonic loops are decompressed. Pelvis: Prostatic calcifications are present. Urinary bladder is decompressed. No free pelvic fluid. Retroperitoneum: Significant increase in size of abdominal aortic aneurysm, measuring maximum diameter of 5.2 x 5.8 cm. There is active extravasation of contrast from the mid abdominal infrarenal aneurysm, consistent with active hemorrhage. Large retroperitoneal hematoma is identified adjacent to the aorta, extending into the left para renal space. There is significant mural thrombus at the level of the aorta rupture. Tortuous  opacified common iliac arteries, internal and external iliac arteries, and femoral arteries bilaterally. Abdominal wall: Unremarkable. Osseous structures: Degenerative changes are identified in the thoracolumbar spine. No suspicious lytic or blastic lesions are identified. Review of the MIP images confirms the above findings. IMPRESSION: 1. Abdominal aortic aneurysm with acute rupture and active extravasation into the left retroperitoneum. 2. 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 3. Large retroperitoneal hematoma displaces the kidney. 4. A large lower pole solid renal mass is suspicious for renal cell carcinoma. 5. Cardiomegaly and coronary artery disease. 6. Colonic diverticulosis. Critical Value/emergent results were called by telephone at the time of interpretation on 06/21/2015 at 4:45 pm to Dr. Elnora Morrison , who verbally acknowledged these results. The Electronically Signed   By: Nolon Nations M.D.   On: 06/15/2015 17:05     STUDIES:    CULTURES: None  ANTIBIOTICS: Ancef (periop)  SIGNIFICANT EVENTS:   LINES/TUBES: RIJ MAC in place 3/19  ETT 3/19 Foley 3/19  DISCUSSION: Manuel Armstrong is a 55M s/p emergent repair of a ruptured abdominal aortic aneurysm with significant intraoperative transfusion requirements and post-op Hgb  of 6.8. He is requiring pressor support and remains intubated.   ASSESSMENT / PLAN:  PULMONARY A: Intubation - anesthesia did not attempt extubation post-operatively P:   Continue ventilatory support Wean as tolerated SBT when able  CARDIOVASCULAR A:  Hemorrhagic / hypovolemic shock  Ruptured AAA s/p repair Atrial fibrillation P:  Continue resuscitation with goal Hgb >7 Transfuse 1 u pRBC now for Hgb 6.8 Maintain active crossmatch Maintain 1:1:1 ratio for FFP and PLTs if more pRBCs are given Vascular managing operative issues No anticoagulation for a fib for now; await vascular recs regarding resuming a/c  RENAL A:   Acute kidney injury Cr. 1.5 (baseline ~1.2) L renal abnormality on CT; concern for L renal infarct P:   Continue fluid resuscitation Monitor Cr Avoid nephrotoxins Urology will need to address L renal abnormality at some point  GASTROINTESTINAL A:   No acute issues P:   Monitor for bowel function NPO for now  HEMATOLOGIC A:   Acute blood loss anemia 2/2 ruptured AAA P:  Continue massive transfusion protocol to maintain Hgb >8 if still bleeding or >7 if no active bleeding  INFECTIOUS A:   Nothing acute P:   Perioperative Ancef  ENDOCRINE A:   No acute issues   P:   Monitor  NEUROLOGIC A:   Possible seizure activity in ED P:   Wean Precedex to assess neuro status If slow to wake up, CT head and EEG   FAMILY  - Updates: no family available.   - Inter-disciplinary family meet or Palliative Care meeting due by:  day 7  The patient is critically ill with multiple organ system failure and requires high complexity decision making for assessment and support, frequent evaluation and titration of therapies, advanced monitoring, review of radiographic studies and interpretation of complex data.   Critical Care Time devoted to patient care services, exclusive of separately billable procedures, described in this note is 45 minutes.   Yisroel Ramming, MD Pulmonary and [place] Pager: [phone]  06/15/2015, 9:04 PM

## 2015-06-11 NOTE — Consult Note (Signed)
[facility] [place]  Patient name: Manuel Armstrong MRN: [id number] DOB: 1919/11/29 Sex: male  REASON FOR CONSULT: Ruptured abdominal aortic aneurysm. Consult is from the emergency department  HPI: Manuel Armstrong is a 82 y.o. male, who presented to the emergency department with abdominal pain. In the waiting room he was appearing to have a seizure and was taken back immediately. Ultrasound showed evidence of a ruptured abdominal aortic aneurysm. He was hypotensive. CT scan shows a ruptured abdominal aortic aneurysm with free intraperitoneal blood.  The patient has a history of atrial fibrillation and is on Eliquis.   The family states that he does not have a history of previous myocardial infarction or history of congestive heart failure. He did have a stroke approximately 2 years ago.  Past Medical History  Diagnosis Date  . Hypertension   . Cancer [facility])     Family History  Problem Relation Age of Onset  . Hypertension Mother   . Stroke Mother   . Lung cancer Brother     SOCIAL HISTORY: Social History   Social History  . Marital Status: Widowed    Spouse Name: N/A  . Number of Children: 5  . Years of Education: 12th   Occupational History  . retired    Social History Main Topics  . Smoking status: Former Research scientist (life sciences)  . Smokeless tobacco: Never Used  . Alcohol Use: 0.0 oz/week    0 Standard drinks or equivalent per week     Comment: 2-3 beers a day, all his life  . Drug Use: No  . Sexual Activity: No   Other Topics Concern  . Not on file   Social History Narrative   Patient lives with his son and daughter in law   Patient drinks coffee daily   Patient is right handed    No Known Allergies  No current facility-administered medications for this encounter.   Current Outpatient Prescriptions  Medication Sig Dispense Refill  . BESIVANCE 0.6 % SUSP Place 1 drop into the right eye 4 (four) times daily.  12  . beta carotene w/minerals  (OCUVITE) tablet Take 2 tablets by mouth daily.    Marland Kitchen ELIQUIS 5 MG TABS tablet TAKE 1 TABLET BY MOUTH TWICE A DAY 60 tablet 11  . ferrous fumarate (HEMOCYTE - 106 MG FE) 325 (106 FE) MG TABS tablet Take 1 tablet by mouth daily.    . metoprolol succinate (TOPROL-XL) 50 MG 24 hr tablet TAKE 1 TABLET BY MOUTH DAILY. TAKE WITH OR IMMEDIATELY FOLLOWING A MEAL 30 tablet 5  . Multiple Vitamins-Minerals (MULTIVITAMINS) CHEW Chew 1 tablet by mouth daily.    . simvastatin (ZOCOR) 10 MG tablet Take 1 tablet (10 mg total) by mouth at bedtime. 30 tablet 5    REVIEW OF SYSTEMS: Given the urgency of the situation this was not obtained.   PHYSICAL EXAM: Filed Vitals:   06/17/2015 1352 06/21/2015 1530 06/08/2015 1545  BP: 165/93 76/61 83/66   Pulse: 83 95 84  Temp: 97.6 F (36.4 C)    TempSrc: Oral    Resp: 20 22 24   SpO2: 95% 97% 97%    GENERAL: The patient is a well-nourished male, in acute distress. The vital signs are documented above. CARDIAC: There is an irregular rhythm.  VASCULAR: Unable to palpate pedal pulses. Palpable femoral pulses. PULMONARY: There is good air exchange bilaterally without wheezing or rales. ABDOMEN: distended and tender. MUSCULOSKELETAL: There are no major deformities or cyanosis. NEUROLOGIC: No focal weakness  or paresthesias are detected. SKIN: There are no ulcers or rashes noted. PSYCHIATRIC: The patient has a normal affect.  DATA:  CT scan of the abdomen shows a ruptured abdominal aortic aneurysm with evidence of active bleeding. The neck of the aneurysm appears fairly short and I think this quick as the approach would be direct open repair.  MEDICAL ISSUES:  RUPTURED ABDOMINAL AORTIC ANEURYSM: This patient has a ruptured abdominal aortic aneurysm with hypotension. He is 82 years old and is on Eliquis. I discussed the critical nature of the situation with the family and understand that the chance of survival is small. I were they wish to proceed with the understanding  that postoperatively he would not want to be on prolonged ventilatory support or other support. We will proceed urgently.   Deitra Mayo [facility] [place]: [phone]

## 2015-06-11 NOTE — Transfer of Care (Signed)
Immediate Anesthesia Transfer of Care Note  Patient: Manuel Armstrong  Procedure(s) Performed: Procedure(s): ANEURYSM ABDOMINAL AORTIC REPAIR (N/A)  Patient Location: SICU  Anesthesia Type:General  Level of Consciousness: sedated  Airway & Oxygen Therapy: Patient remains intubated per anesthesia plan and Patient placed on Ventilator (see vital sign flow sheet for setting)  Post-op Assessment: Report given to RN and Post -op Vital signs reviewed and stable  Post vital signs: Reviewed and stable  Last Vitals:  Filed Vitals:   06/14/2015 1600 06/03/2015 1615  BP: 74/63 85/65  Pulse: 84 78  Temp:    Resp: 29 21    Complications: No apparent anesthesia complications

## 2015-06-11 NOTE — Op Note (Signed)
NAME: PIETRO MOFFET   MRN: [id number] DOB: 1920-02-25    DATE OF OPERATION: 06/23/2015  PREOP DIAGNOSIS: Ruptured abdominal aortic aneurysm  POSTOP DIAGNOSIS: Same  PROCEDURE:  1. Lysis of adhesions 2. Open repair of ruptured abdominal aortic aneurysm with 16 mm tube graft  SURGEON: Judeth Cornfield. Scot Dock, MD, FACS  ASSIST: Silva Bandy, [facility]  ANESTHESIA: Gen.   EBL: per anesthesia record  INDICATIONS: TARVIS MANGELS is a 82 y.o. male who developed the acute onset of abdominal pain today. He became profoundly hypotensive and CT scan showed a ruptured abdominal aortic aneurysm. In addition he was noted to have infarction of the left kidney with a large kidney mass and possible bleeding from the kidney according to radiology. Of note the patient was on Eliquis because of a history of atrial fibrillation. He was significantly hypotensive and after discussion with the family they wished to pursue elective repair. His abdomen was significantly distended and given the issue with the left kidney I elected to proceed with open repair.  FINDINGS: there was a massive retroperitoneal hematoma. The left kidney was swollen. I did discuss the CT findings with urology intraoperatively and they agreed that this was a secondary issue given the severity of the situation. If the patient survives and this could be addressed electively.  TECHNIQUE: The patient was taken urgently to the operating room. Monitoring lines were placed by anesthesia. The abdomen was prepped and draped in usual sterile fashion. The patient then received general anesthetic. He did drop his pressure with induction of anesthesia. He was hypotensive from the time of his arrival in the emergency department. The abdomen was entered through a midline incision. There was a large amount of fluid in the belly and a massive retroperitoneal hematoma. There was a loop of bowel adherent to the anterior wall which I was able to dissected free to  enter the belly. In addition there were adhesions in the greater omentum to the left side where the kidney issue was made were taken down to allow mobilization of the transverse colon superiorly. Small bowels mobilized to the right. There was a massive retroperitoneal hematoma which extended up to the diaphragm. I was able to dissected down to the neck of the aneurysm below the renal arteries and place a clamp and just did result in significant improvement in his blood pressure. I then dissected the anterior wall of the aorta down to the bifurcation and ultimately was able to clamp both common iliac arteries.  X-ray aneurysm was opened. There was a large hole in the lateral wall on the left. There was no significant lumbar bleeding. A 16 mm tube graft was selected and this was sewn into into the infrarenal aorta with the felt cuff using running 3-0 Prolene suture. The proximal anastomosis was tested and was hemostatic and the graft was flushed. The infrarenal aorta was then reclamped. The graft was then pulled to the proper length for anastomosis to the distal aorta. This anastomosis was done with running 3-0 Prolene suture. The clamps were then released and I controlled the graft to allow anesthesia to catch up as he did have a drop in blood pressure with release of the clamp.  At the completion there was generalized oozing from multiple areas related to his Eliquis. He was aggressively resuscitated by anesthesia with packed red blood cells, platelets, and FFP. He continued to have significant problem with oozing from multiple areas. Several pledgeted sutures were placed at the proximal anastomosis which  were hemostatic. Ultimately his blood pressure seemed to stabilize and despite some mild continued using a felt the safest approach was to close his abdomen and continue the aggressive resuscitation. His urine output was low throughout the case.  The aneurysm was closed over the aorta with running 2-0 Vicryl.  The retro-peritoneal tissue was closed with running 2-0 Vicryl. The abdominal contents were returned to the normal position and then the fascial layer closed with 2 #1 PDS sutures. The skin was closed with staples. At the completion had strong femoral pulses. Both feet appear actively perfused. The patient was then transferred to the intensive care unit in critical condition. All needle and sponge counts were correct.  Of note after discussion with the family they wished that the patient be DO NOT RESUSCITATE if he had a cardiac arrest. However until then they wanted full support and I have consult critical care medicine.  Deitra Mayo, MD, FACS [facility]  DATE OF DICTATION:   06/08/2015

## 2015-06-11 NOTE — Progress Notes (Signed)
[place] Progress Note Patient Name: Manuel Armstrong DOB: 11/12/1919 MRN: [id number]   Date of Service  06/20/2015  HPI/Events of Note  Hgb 6.8  coagulopathic  eICU Interventions  Transfuse one more unit PRBC , Two units FFP, one unit pheresed platelets     Intervention Category Major Interventions: Hemorrhage - evaluation and management  Asencion Noble 05/30/2015, 10:28 PM

## 2015-06-11 NOTE — ED Notes (Signed)
To OR at [facility]

## 2015-06-12 ENCOUNTER — Encounter (HOSPITAL_COMMUNITY): Payer: Self-pay | Admitting: Vascular Surgery

## 2015-06-12 ENCOUNTER — Inpatient Hospital Stay (HOSPITAL_COMMUNITY): Payer: Medicare Other

## 2015-06-12 DIAGNOSIS — R570 Cardiogenic shock: Secondary | ICD-10-CM

## 2015-06-12 DIAGNOSIS — J9601 Acute respiratory failure with hypoxia: Secondary | ICD-10-CM

## 2015-06-12 DIAGNOSIS — I713 Abdominal aortic aneurysm, ruptured: Principal | ICD-10-CM

## 2015-06-12 LAB — BASIC METABOLIC PANEL
ANION GAP: 23 — AB (ref 5–15)
Anion gap: 24 — ABNORMAL HIGH (ref 5–15)
BUN: 23 mg/dL — AB (ref 6–20)
BUN: 23 mg/dL — AB (ref 6–20)
CALCIUM: 8.5 mg/dL — AB (ref 8.9–10.3)
CHLORIDE: 104 mmol/L (ref 101–111)
CO2: 8 mmol/L — AB (ref 22–32)
CO2: 11 mmol/L — ABNORMAL LOW (ref 22–32)
Calcium: 8.7 mg/dL — ABNORMAL LOW (ref 8.9–10.3)
Chloride: 107 mmol/L (ref 101–111)
Creatinine, Ser: 2.6 mg/dL — ABNORMAL HIGH (ref 0.61–1.24)
Creatinine, Ser: 2.67 mg/dL — ABNORMAL HIGH (ref 0.61–1.24)
GFR calc Af Amer: 23 mL/min — ABNORMAL LOW (ref >60)
GFR calc Af Amer: 22 mL/min — ABNORMAL LOW (ref >60)
GFR calc non Af Amer: 19 mL/min — ABNORMAL LOW (ref >60)
GFR, EST NON AFRICAN AMERICAN: 20 mL/min — AB (ref >60)
GLUCOSE: 105 mg/dL — AB (ref 65–99)
Glucose, Bld: 161 mg/dL — ABNORMAL HIGH (ref 65–99)
Potassium: 7 mmol/L (ref 3.5–5.1)
Potassium: 6.5 mmol/L (ref 3.5–5.1)
SODIUM: 139 mmol/L (ref 135–145)
Sodium: 138 mmol/L (ref 135–145)

## 2015-06-12 LAB — POCT I-STAT 7, (LYTES, BLD GAS, ICA,H+H)
ACID-BASE DEFICIT: 8 mmol/L — AB (ref 0.0–2.0)
Bicarbonate: 17 mEq/L — ABNORMAL LOW (ref 20.0–24.0)
CALCIUM ION: 0.97 mmol/L — AB (ref 1.13–1.30)
HEMATOCRIT: 24 % — AB (ref 39.0–52.0)
Hemoglobin: 8.2 g/dL — ABNORMAL LOW (ref 13.0–17.0)
O2 Saturation: 100 %
POTASSIUM: 4.1 mmol/L (ref 3.5–5.1)
SODIUM: 137 mmol/L (ref 135–145)
TCO2: 18 mmol/L (ref 0–100)
pCO2 arterial: 31 mmHg — ABNORMAL LOW (ref 35.0–45.0)
pH, Arterial: 7.344 — ABNORMAL LOW (ref 7.350–7.450)
pO2, Arterial: 361 mmHg — ABNORMAL HIGH (ref 80.0–100.0)

## 2015-06-12 LAB — POCT I-STAT 4, (NA,K, GLUC, HGB,HCT)
GLUCOSE: 204 mg/dL — AB (ref 65–99)
HEMATOCRIT: 17 % — AB (ref 39.0–52.0)
Hemoglobin: 5.8 g/dL — CL (ref 13.0–17.0)
POTASSIUM: 3.9 mmol/L (ref 3.5–5.1)
Sodium: 143 mmol/L (ref 135–145)

## 2015-06-12 LAB — POCT I-STAT 3, ART BLOOD GAS (G3+)
ACID-BASE DEFICIT: 11 mmol/L — AB (ref 0.0–2.0)
ACID-BASE DEFICIT: 15 mmol/L — AB (ref 0.0–2.0)
ACID-BASE DEFICIT: 7 mmol/L — AB (ref 0.0–2.0)
ACID-BASE DEFICIT: 8 mmol/L — AB (ref 0.0–2.0)
BICARBONATE: 17.8 meq/L — AB (ref 20.0–24.0)
Bicarbonate: 11.3 mEq/L — ABNORMAL LOW (ref 20.0–24.0)
Bicarbonate: 15.7 mEq/L — ABNORMAL LOW (ref 20.0–24.0)
Bicarbonate: 18.2 mEq/L — ABNORMAL LOW (ref 20.0–24.0)
O2 SAT: 100 %
O2 SAT: 90 %
O2 SAT: 98 %
O2 Saturation: 100 %
PCO2 ART: 36 mmHg (ref 35.0–45.0)
PH ART: 7.374 (ref 7.350–7.450)
PH ART: 7.218 — AB (ref 7.350–7.450)
PO2 ART: 213 mmHg — AB (ref 80.0–100.0)
Patient temperature: 33
Patient temperature: 36.8
TCO2: 12 mmol/L (ref 0–100)
TCO2: 17 mmol/L (ref 0–100)
TCO2: 19 mmol/L (ref 0–100)
TCO2: 19 mmol/L (ref 0–100)
pCO2 arterial: 28 mmHg — ABNORMAL LOW (ref 35.0–45.0)
pCO2 arterial: 29.1 mmHg — ABNORMAL LOW (ref 35.0–45.0)
pCO2 arterial: 32.2 mmHg — ABNORMAL LOW (ref 35.0–45.0)
pH, Arterial: 7.279 — ABNORMAL LOW (ref 7.350–7.450)
pH, Arterial: 7.311 — ABNORMAL LOW (ref 7.350–7.450)
pO2, Arterial: 137 mmHg — ABNORMAL HIGH (ref 80.0–100.0)
pO2, Arterial: 196 mmHg — ABNORMAL HIGH (ref 80.0–100.0)
pO2, Arterial: 64 mmHg — ABNORMAL LOW (ref 80.0–100.0)

## 2015-06-12 LAB — CBC
HEMATOCRIT: 26.1 % — AB (ref 39.0–52.0)
Hemoglobin: 9 g/dL — ABNORMAL LOW (ref 13.0–17.0)
MCH: 29.2 pg (ref 26.0–34.0)
MCHC: 34.5 g/dL (ref 30.0–36.0)
MCV: 84.7 fL (ref 78.0–100.0)
Platelets: 125 10*3/uL — ABNORMAL LOW (ref 150–400)
RBC: 3.08 MIL/uL — ABNORMAL LOW (ref 4.22–5.81)
RDW: 15 % (ref 11.5–15.5)
WBC: 8.9 10*3/uL (ref 4.0–10.5)

## 2015-06-12 LAB — PREPARE PLATELET PHERESIS
UNIT DIVISION: 0
UNIT DIVISION: 0
Unit division: 0
Unit division: 0

## 2015-06-12 LAB — PREPARE CRYOPRECIPITATE: Unit division: 0

## 2015-06-12 LAB — COMPREHENSIVE METABOLIC PANEL
ALBUMIN: 3.1 g/dL — AB (ref 3.5–5.0)
ALT: 923 U/L — ABNORMAL HIGH (ref 17–63)
ANION GAP: 20 — AB (ref 5–15)
AST: 1146 U/L — ABNORMAL HIGH (ref 15–41)
Alkaline Phosphatase: 46 U/L (ref 38–126)
BILIRUBIN TOTAL: 4.2 mg/dL — AB (ref 0.3–1.2)
BUN: 22 mg/dL — ABNORMAL HIGH (ref 6–20)
CO2: 16 mmol/L — ABNORMAL LOW (ref 22–32)
Calcium: 8.7 mg/dL — ABNORMAL LOW (ref 8.9–10.3)
Chloride: 103 mmol/L (ref 101–111)
Creatinine, Ser: 1.86 mg/dL — ABNORMAL HIGH (ref 0.61–1.24)
GFR calc Af Amer: 34 mL/min — ABNORMAL LOW (ref >60)
GFR calc non Af Amer: 29 mL/min — ABNORMAL LOW (ref >60)
GLUCOSE: 168 mg/dL — AB (ref 65–99)
POTASSIUM: 5.8 mmol/L — AB (ref 3.5–5.1)
Sodium: 139 mmol/L (ref 135–145)
TOTAL PROTEIN: 4.8 g/dL — AB (ref 6.5–8.1)

## 2015-06-12 LAB — AMYLASE: Amylase: 149 U/L — ABNORMAL HIGH (ref 28–100)

## 2015-06-12 LAB — PREPARE FRESH FROZEN PLASMA
UNIT DIVISION: 0
UNIT DIVISION: 0
Unit division: 0
Unit division: 0

## 2015-06-12 LAB — GLUCOSE, CAPILLARY
GLUCOSE-CAPILLARY: 93 mg/dL (ref 65–99)
GLUCOSE-CAPILLARY: 95 mg/dL (ref 65–99)
Glucose-Capillary: 76 mg/dL (ref 65–99)

## 2015-06-12 LAB — MAGNESIUM: MAGNESIUM: 1.7 mg/dL (ref 1.7–2.4)

## 2015-06-12 LAB — BLOOD PRODUCT ORDER (VERBAL) VERIFICATION

## 2015-06-12 LAB — PREPARE RBC (CROSSMATCH)

## 2015-06-12 LAB — MRSA PCR SCREENING: MRSA BY PCR: NEGATIVE

## 2015-06-12 MED ORDER — ANTISEPTIC ORAL RINSE SOLUTION (CORINZ)
7.0000 mL | OROMUCOSAL | Status: DC
  Administered 2015-06-12 (×4): 7 mL via OROMUCOSAL

## 2015-06-12 MED ORDER — PHENYLEPHRINE HCL 10 MG/ML IJ SOLN
0.0000 ug/min | INTRAVENOUS | Status: DC
  Administered 2015-06-12: 20 ug/min via INTRAVENOUS
  Administered 2015-06-12: 360 ug/min via INTRAVENOUS
  Filled 2015-06-12 (×3): qty 4

## 2015-06-12 MED ORDER — MORPHINE BOLUS VIA INFUSION
5.0000 mg | INTRAVENOUS | Status: DC | PRN
  Filled 2015-06-12: qty 20

## 2015-06-12 MED ORDER — MAGNESIUM SULFATE IN D5W 10-5 MG/ML-% IV SOLN
1.0000 g | Freq: Once | INTRAVENOUS | Status: AC
  Administered 2015-06-12: 1 g via INTRAVENOUS
  Filled 2015-06-12: qty 100

## 2015-06-12 MED ORDER — MORPHINE SULFATE 25 MG/ML IV SOLN
10.0000 mg/h | INTRAVENOUS | Status: DC
  Administered 2015-06-12: 10 mg/h via INTRAVENOUS
  Filled 2015-06-12: qty 10

## 2015-06-12 MED FILL — Heparin Sodium (Porcine) Inj 1000 Unit/ML: INTRAMUSCULAR | Qty: 30 | Status: AC

## 2015-06-12 MED FILL — Sodium Chloride IV Soln 0.9%: INTRAVENOUS | Qty: 6000 | Status: AC

## 2015-06-13 LAB — PREPARE FRESH FROZEN PLASMA
UNIT DIVISION: 0
UNIT DIVISION: 0
UNIT DIVISION: 0
Unit division: 0
Unit division: 0
Unit division: 0
Unit division: 0
Unit division: 0
Unit division: 0
Unit division: 0
Unit division: 0
Unit division: 0

## 2015-06-14 LAB — TYPE AND SCREEN
ABO/RH(D): AB POS
Antibody Screen: NEGATIVE
UNIT DIVISION: 0
UNIT DIVISION: 0
UNIT DIVISION: 0
UNIT DIVISION: 0
UNIT DIVISION: 0
UNIT DIVISION: 0
UNIT DIVISION: 0
UNIT DIVISION: 0
UNIT DIVISION: 0
UNIT DIVISION: 0
Unit division: 0
Unit division: 0
Unit division: 0
Unit division: 0
Unit division: 0
Unit division: 0
Unit division: 0
Unit division: 0

## 2015-06-21 ENCOUNTER — Encounter (HOSPITAL_COMMUNITY): Payer: Self-pay | Admitting: Vascular Surgery

## 2015-06-24 NOTE — OR Nursing (Signed)
LATE ENTRY: procedure time corrected to accurate time; 1658.

## 2015-06-24 NOTE — Progress Notes (Signed)
PT Cancellation Note  Patient Details Name: Manuel Armstrong MRN: [id number] DOB: 1919/10/02   Cancelled Treatment:    Reason Eval/Treat Not Completed: Patient not medically ready Pt currently intubated and on pressors. Will follow.   Marguarite Arbour A Rhylan Gross 2015/06/30, 9:04 AM  Wray Kearns, PT, DPT [phone]

## 2015-06-24 NOTE — Discharge Summary (Signed)
[facility] Death Summary  Manuel Armstrong Sep 02, 1919 82 y.o. male  [id number]  Admission Date: 2015-07-06  Date of Death: Jul 07, 2015  Physician: Deitra Mayo, MD  Admission Diagnosis: Abdominal pain, left lateral [R10.9] Hypotension, unspecified hypotension type [I95.9] Abdominal aortic aneurysm (AAA) >39 mm diameter (HCC) [I71.4] Abdominal aortic aneurysm (AAA) ([place]) [I71.4]  HPI:   This is a 82 y.o. male who presented to the emergency department with abdominal pain. In the waiting room he was appearing to have a seizure and was taken back immediately. Ultrasound showed evidence of a ruptured abdominal aortic aneurysm. He was hypotensive. CT scan shows a ruptured abdominal aortic aneurysm with free intraperitoneal blood.  The patient has a history of atrial fibrillation and is on Eliquis.   The family states that he does not have a history of previous myocardial infarction or history of congestive heart failure. He did have a stroke approximately 2 years ago.  CT of the abdomen revealed a ruptured abdominal aortic aneurysm with evidence of active bleeding. The neck of the aneurysm appeared fairly short and it was felt that an open repair was the best approach. There was a large retroperitoneal hematoma. The left kidney was anteriorly displaced with a mass that was suspicious for renal cell carcinoma. Dr. Scot Dock discussed the critical nature of the situation with the family and they understood that the chance of survival is small. The family and patient wished to proceed with the understanding that postoperatively he would not want to be on prolonged ventilatory support or other support. He was taken to the operating room on an emergent basis.   Hospital Course:  The patient was taken to the operating room on 07/06/2015 and emergently underwent: open repair of ruptured abdominal aneurysm and lysis of adhesions.   From Dr. Nicole Cella operative report. "At the  completion, there was generalized oozing from multiple areas related to his Eliquis. He was aggressively resuscitated by anesthesia with packed red blood cells, platelets, and FFP. He continued to have significant problem with oozing from multiple areas. Several pledgeted sutures were placed at the proximal anastomosis which were hemostatic. Ultimately his blood pressure seemed to stabilize and despite some mild continued using a felt the safest approach was to close his abdomen and continue the aggressive resuscitation. His urine output was low throughout the case.  The aneurysm was closed over the aorta with running 2-0 Vicryl. The retro-peritoneal tissue was closed with running 2-0 Vicryl. The abdominal contents were returned to the normal position and then the fascial layer closed with 2 #1 PDS sutures. The skin was closed with staples. At the completion had strong femoral pulses. Both feet appear actively perfused. The patient was then transferred to the intensive care unit in critical condition. All needle and sponge counts were correct.  Of note after discussion with the family they wished that the patient be DO NOT RESUSCITATE if he had a cardiac arrest. However until then they wanted full support and I have consult critical care medicine."  Intraoperatively, the patient required aggressive resuscitation and received 7 units pRBCs, 10 units FFP, 3 platelets and cryo. He was given back 1925 cc cellsaver. He made approximately 50 cc of urine post-op.  Critical care was consulted for assistance. He arrived to the ICU intubated, sedated and requiring norepinephrine and phenylephrine.   Overnight, the patient required additional an additional unit of pRBCs, two units of FFP and one unit of platelets for a hemoglobin of 6.8. His ABG revealed metabolic acidosis.  POD 1: The patient was anuric. The patient had made it clear preoperatively that he did not want dialysis. He still required levophed for  blood pressure support and full ventilatory support. The patient had ongoing atrial fibrillation with mild rapid ventricular response. His levophed was transitioned to neo-synephrine. He was also hyperkalemic. The patient was unresponsive on exam. The family decided to take the patient to transition to comfort care and come off of pressors but not the ventilator. The patient was later pronounced dead at 1500.   CBC    Component Value Date/Time   WBC 8.9 2015/07/01 0442   RBC 3.08* 07-01-2015 0442   RBC 3.79* 07/27/2013 0635   HGB 9.0* July 01, 2015 0442   HCT 26.1* 2015/07/01 0442   PLT 125* 01-Jul-2015 0442   MCV 84.7 07-01-2015 0442   MCH 29.2 Jul 01, 2015 0442   MCHC 34.5 01-Jul-2015 0442   RDW 15.0 July 01, 2015 0442   LYMPHSABS 1.1 07/26/2013 2342   MONOABS 0.6 07/26/2013 2342   EOSABS 0.1 07/26/2013 2342   BASOSABS 0.1 07/26/2013 2342    BMET    Component Value Date/Time   NA 139 07-01-15 1045   K 6.5* 2015/07/01 1045   CL 104 2015/07/01 1045   CO2 11* Jul 01, 2015 1045   GLUCOSE 161* 07-01-2015 1045   BUN 23* 2015/07/01 1045   CREATININE 2.67* 2015/07/01 1045   CALCIUM 8.7* 07/01/2015 1045   GFRNONAA 19* 2015-07-01 1045   GFRAA 22* 07-01-15 1045       Discharge Diagnosis:  Abdominal pain, left lateral [R10.9] Hypotension, unspecified hypotension type [I95.9] Abdominal aortic aneurysm (AAA) >39 mm diameter (HCC) [I71.4] Abdominal aortic aneurysm (AAA) ([place]) [I71.4]  Secondary Diagnosis: Patient Active Problem List   Diagnosis Date Noted  . Ruptured abdominal aortic aneurysm (AAA) ([place]) 06/02/2015  . Essential hypertension 06/16/2014  . HLD (hyperlipidemia) 06/16/2014  . Stroke ([place]) 07/27/2013  . Anemia 07/27/2013  . Hypertension 07/27/2013  . Atrial fibrillation ([place]) 07/27/2013  . CVA (cerebral infarction) 07/27/2013  . Leg swelling 07/27/2013   Past Medical History  Diagnosis Date  . Hypertension   . Cancer [facility])        Medication List    ASK your  doctor about these medications        BESIVANCE 0.6 % Susp  Generic drug:  Besifloxacin HCl  Place 1 drop into the right eye 4 (four) times daily.     MULTIVITAMINS Chew  Chew 1 tablet by mouth daily.     beta carotene w/minerals tablet  Take 2 tablets by mouth daily.     ELIQUIS 5 MG Tabs tablet  Generic drug:  apixaban  TAKE 1 TABLET BY MOUTH TWICE A DAY     ferrous fumarate 325 (106 Fe) MG Tabs tablet  Commonly known as:  HEMOCYTE - 106 mg FE  Take 1 tablet by mouth daily.     metoprolol succinate 50 MG 24 hr tablet  Commonly known as:  TOPROL-XL  TAKE 1 TABLET BY MOUTH DAILY. TAKE WITH OR IMMEDIATELY FOLLOWING A MEAL     simvastatin 10 MG tablet  Commonly known as:  ZOCOR  Take 1 tablet (10 mg total) by mouth at bedtime.        Patient's condition: deceased   Virgina Jock, [place] [facility] [phone] 06/13/2015  12:24 PM

## 2015-06-24 NOTE — Progress Notes (Signed)
PULMONARY / CRITICAL CARE MEDICINE   Name: Manuel Armstrong MRN: [id number] DOB: 1919/09/05    ADMISSION DATE:  06/02/2015 CONSULTATION DATE:  05/31/2015  REFERRING MD:  Rosalia Hammers  CHIEF COMPLAINT:  Ruptured abdominal aortic aneurysm  HISTORY OF PRESENT ILLNESS:   Mr. Manuel Armstrong is an 82M who presented to the ED with complaints of acute onset abdominal pain. On arrival to the ED he had some seizure like activity and initial vitals revealed significant hypotension. CT abdomen showed a ruptured AAA with retroperitoneal hematoma. There was also an incidental finding off a large left renal abnromality concerning for mass vs hematoma. The patient was taken emergently to the OR for an open approach at repair of the ruptured AAA. Of note, he had been on Eliquis for atrial fibrillation. Intraoperatively a large retroperitoneal hematoma was evacuated. A graft was placed. The patient required aggressive resuscitation and received 7u pRBC, 10u FFP, 3 PLT as well as cryo. He was also given back 1925cc from [facility].   On arrival to the ICU he is intubated, sedated (on Precedex), and requiring norepinephrine and phenylephrine.   Per the notes, the surgeon had a discussion with the family about the critical nature of these events and they stated that he would not want prolonged life support. Unfortunately, we were unable to locate them after his arrival to ICU. His baseline function prior to these events is unclear from the notes.    SUBJECTIVE:  Worsening renal failure overnight. Now hyperkalemic, oliguric.  On max pressors.    VITAL SIGNS: BP 86/55 mmHg  Pulse 85  Temp(Src) 99 F (37.2 C) (Oral)  Resp 12  Ht 6' (1.829 m)  Wt 68.1 kg (150 lb 2.1 oz)  BMI 20.36 kg/m2  SpO2 98%  HEMODYNAMICS:    VENTILATOR SETTINGS: Vent Mode:  [-] SIMV;PSV;PRVC FiO2 (%):  [50 %] 50 % Set Rate:  [12 bmp] 12 bmp Vt Set:  HJ:[id number] mL] 620 mL PEEP:  [5 cmH20] 5 cmH20 Pressure Support:  [10 cmH20] 10 cmH20 Plateau  Pressure:  [28 cmH20-30 cmH20] 30 cmH20  INTAKE / OUTPUT: I/O last 3 completed shifts: In: 14443.7 [I.V.:5377.7; US:197844; IV Piggyback:100] Out: [id number] [Urine:145; Emesis/NG output:5; Blood:2900]  PHYSICAL EXAMINATION: Physical Exam: Temp:  [90.9 F (32.7 C)-100.2 F (37.9 C)] 99 F (37.2 C) (03/20 1111) Pulse Rate:  [25-137] 85 (03/20 1111) Resp:  [10-29] 12 (03/20 1111) BP: (58-165)/(29-100) 86/55 mmHg (03/20 1111) SpO2:  [76 %-100 %] 98 % (03/20 1111) Arterial Line BP: (56-176)/(43-96) 176/49 mmHg (03/20 1000) FiO2 (%):  [50 %] 50 % (03/20 1111) Weight:  [68.1 kg (150 lb 2.1 oz)] 68.1 kg (150 lb 2.1 oz) (03/19 2100)  General Well nourished, well developed, intubated, sedated  HEENT No gross abnormalities. Oropharynx difficult to assess 2/2 ETT, OGT. Pupils equal, pinpoint.   Pulmonary Clear to auscultation bilaterally with no wheezes, rales or ronchi. Minimal breathing over vent.    Cardiovascular Normal rate, irregularly irregular rhythm. S1, s2. No m/r/g. Radial pulses weak, DPs dopplerable  Abdomen Distended, absent bowel sounds, no palpable organomegaly or masses. Midline dressing in place with minimal bleeding noted.  Musculoskeletal No bony abnormalities  Lymphatics No cervical, supraclavicular or axillary adenopathy.   Neurologic Sedated.   Skin/Integuement No rash, no clubbing. Bilateral feet with cyanosis and discoloration of plantar surface and lateral aspect of foot as well as toes.      LABS:  BMET  Recent Labs Lab Jul 12, 2015 0442 07/12/2015 0953 07/12/15 1045  NA 139 138 139  K 5.8* 7.0* 6.5*  CL 103 107 104  CO2 16* 8* 11*  BUN 22* 23* 23*  CREATININE 1.86* 2.60* 2.67*  GLUCOSE 168* 105* 161*    Electrolytes  Recent Labs Lab 06/19/2015 2055 2015-06-23 0442 2015/06/23 0953 06-23-2015 1045  CALCIUM 8.2* 8.7* 8.5* 8.7*  MG 1.5* 1.7  --   --     CBC  Recent Labs Lab 05/28/2015 1444  06/02/2015 1922 06/13/2015 2055 06/17/2015 2104 06/23/2015 0442  WBC  9.2  --   --  6.1  --  8.9  HGB 12.3*  < > 10.1* 6.8* 5.8* 9.0*  HCT 38.1*  < > 31.3* 20.4* 17.0* 26.1*  PLT 195  --  44*  44* 86*  --  125*  < > = values in this interval not displayed.  Coag's  Recent Labs Lab 06/18/2015 1608 06/08/2015 1922 06/04/2015 2055  APTT 36 47* 54*  INR 1.79* 2.15* 2.33*    Sepsis Markers No results for input(s): LATICACIDVEN, PROCALCITON, O2SATVEN in the last 168 hours.  ABG  Recent Labs Lab 23-Jun-2015 0012 06/23/2015 0417 06-23-15 0916  PHART 7.279* 7.311* 7.218*  PCO2ART 32.2* 36.0 28.0*  PO2ART 196.0* 64.0* 137.0*    Liver Enzymes  Recent Labs Lab 06/20/2015 1444 2015/06/23 0442  AST 32 1146*  ALT 20 923*  ALKPHOS 83 46  BILITOT 1.2 4.2*  ALBUMIN 3.9 3.1*    Cardiac Enzymes No results for input(s): TROPONINI, PROBNP in the last 168 hours.  Glucose  Recent Labs Lab 06/20/2015 1532 05/28/2015 2347 2015-06-23 0353 23-Jun-2015 0912  GLUCAP 136* 95 76 93    Imaging Dg Chest Port 1 View  Jun 23, 2015  CLINICAL DATA:  Abdominal aortic aneurysm. EXAM: PORTABLE CHEST 1 VIEW COMPARISON:  06/10/2015. FINDINGS: Endotracheal tube, NG tube, right IJ sheath in stable position. Mediastinum and hilar structures are normal. Cardiomegaly with normal pulmonary vascularity. Low lung volumes with mild bibasilar atelectasis. Small left pleural effusion . IMPRESSION: 1. Lines and tubes in stable position. 2. Low lung volumes with mild bibasilar atelectasis and/or infiltrates. Small left pleural effusion cannot be excluded. 3.  Stable cardiomegaly . Electronically Signed   By: Marcello Moores  Register   On: 06-23-15 07:36   Dg Chest Port 1 View  06/06/2015  CLINICAL DATA:  Post abdominal aortic aneurysm repair. EXAM: PORTABLE CHEST 1 VIEW COMPARISON:  07/27/2013 FINDINGS: Endotracheal tube with tip measuring 3.1 cm above the carina. Enteric tube tip is off the field of view but below the left hemidiaphragm. Right central venous catheter with tip over the upper SVC region. No  pneumothorax. Mild apical pleural thickening bilaterally. Atelectasis in the lung bases. No blunting of costophrenic angles. No focal consolidation. Calcified and tortuous aorta. Degenerative changes in the shoulders. Normal heart size and pulmonary vascularity. IMPRESSION: Appliances appear in satisfactory location. Atelectasis in the lung bases. Electronically Signed   By: Lucienne Capers M.D.   On: 06/13/2015 21:25   Dg Abd Portable 1v  06/07/2015  CLINICAL DATA:  Post abdominal aortic aneurysm repair. EXAM: PORTABLE ABDOMEN - 1 VIEW COMPARISON:  CT abdomen and pelvis 06/06/2015 FINDINGS: Skin clips along the midline consistent with recent surgery. Surgical clips in the pelvis consistent with previous hernia repairs. Presumed Foley catheter in the bladder. Paucity of gas in the bowel. No small or large bowel distention is suggested. No radiopaque foreign bodies identified. Degenerative changes in the spine and hips. IMPRESSION: Nonobstructive bowel gas pattern. Electronically Signed   By: Lucienne Capers M.D.   On: 06/11/2015  21:27   Ct Angio Chest Aorta W/cm &/or Wo/cm  06/17/2015  CLINICAL DATA:  By report the patient collapsed in the emergency department waiting room, abdominal aortic aneurysm seen on stat ER ultrasound, concern for leak EXAM: CT ANGIOGRAPHY CHEST, ABDOMEN AND PELVIS TECHNIQUE: Multidetector CT imaging through the chest, abdomen and pelvis was performed using the standard protocol during bolus administration of intravenous contrast. Multiplanar reconstructed images and MIPs were obtained and reviewed to evaluate the vascular anatomy. CONTRAST:  16mL OMNIPAQUE IOHEXOL 350 MG/ML SOLN COMPARISON:  None. FINDINGS: CTA CHEST FINDINGS Mediastinum/Nodes: Coronary, aortic arch, and branch vessel atherosclerotic vascular disease. Mild ascending aortic aneurysm, 4 cm diameter, although the main pulmonary artery trunk is 4.2 cm diameter. I do not see signs of acute intramural hematoma on the  noncontrast images. On the arterial phase contrast images, there is a penetrating ulcer anteriorly shown on images 87 through 94 of series 503, likely chronic given the calcification along its margins, and not actively bleeding at this time. The no large pulmonary embolus identified. Scattered mediastinal lymph nodes are observed including a 9 mm left paratracheal node (image 51 series 501) and a 1.3 cm right hilar node (image 64, series 501). Four-chamber cardiomegaly. No pericardial effusion. Calcified left infrahilar lymph nodes compatible with old granulomatous disease. Calcified granuloma in the left lower lobe on image 35 series 5 and 6. Lungs/Pleura: Small right pleural effusion, nonspecific for transudative versus exudative etiology. Markedly severe emphysema. Biapical pleural parenchymal scarring. Pleural-based 0.8 by 0.9 cm right upper lobe pulmonary nodule, image 25 series 506, not calcified. 4 mm left lower lobe nodule, image 34 series 506. Musculoskeletal: Mild grade 1 anterolisthesis at T11-12. Review of the MIP images confirms the above findings. CTA ABDOMEN AND PELVIS FINDINGS Hepatobiliary: Are early enhancement of the left hepatic lobe compared to most of the right hepatic lobe, thought to be due to the early contrast phase. Pancreas: Unremarkable Spleen: Numerous calcified nodules compatible with old granulomatous disease. Adrenals/Urinary Tract: 10.7 by 7.8 cm hyperdense and mildly heterogeneous mass extending from the left kidney lower pole. Adjacent left anterior renal infarct. Left ureter completely obscured. I do not see definite active extravasation from the kidney. Stomach/Bowel: Unremarkable Vascular/Lymphatic: 5.1 by 5.3 cm fusiform infrarenal abdominal aortic aneurysm with acute rupture along the left lateral site, and active bleeding and contrast extravasation into the retroperitoneum shown on image 190 series 501. Very large retroperitoneal hematoma tracking especially on the left side  of foot from the level of the hemidiaphragm down nearly to the inguinal canal. The aneurysm is 7.4 cm in length and begins 3.4 cm below the lowest renal artery which is the left renal artery. The celiac trunk and its branches are patent. The SMA is patent. Both renal arteries are patent the aneurysm extends essentially to the bifurcation. No propagating dissection identified. Reproductive: Curvilinear calcification in the central zone of the prostate gland. Other: No supplemental non-categorized findings. Musculoskeletal: Lumbar spondylosis and degenerative disc disease. Review of the MIP images confirms the above findings. IMPRESSION: 1. Ruptured abdominal aortic aneurysm actively bleeding into the retroperitoneum with a large retroperitoneal hematoma. The bleed is along the left side of the aneurysm. The aneurysm starts 3.4 cm below the lowest (left) renal artery and extends over 7.4 cm basically to the bifurcation. 2. In addition, part of the left kidney has been infarcted and there is a very large hyperdense mass like lesion extending down from the kidney, probably a hematoma in this situation rather than a renal cell  carcinoma. Although I do not see definite active bleeding is from the left kidney, there is a hyperdense rim to the large hematoma which could reflect some low-level bleeding from the left kidney. 3. Chronic appearing ulceration along the anterior aortic margin at the hiatus, not actively bleeding at this time. 4. Minimal ascending aortic aneurysm at 4 cm, although the main pulmonary arterial trunk is similar in size. 5. 8 by 9 mm right upper lobe pulmonary nodule, and 4 mm left lower lobe pulmonary nodule. Once the patient is fully stable last, consider CT, PET-CT, or tissue sampling in 3 months. 6. Other imaging findings of potential clinical significance: Coronary, aortic arch, and branch vessel atherosclerotic vascular disease. Aortoiliac atherosclerotic vascular disease. Small right pleural  effusion. Severe emphysema. Critical Value/emergent results were called by telephone at the time of interpretation on 06/19/2015 at 4:35 pm to Dr. Elnora Morrison , who verbally acknowledged these results. That was basically right when I started looking at the case, because of the severely emergent findings. The patient was already in the operating room. I also telephoned Dr. Scot Dock at 4:54 p.m. in the operating room to make certain he was aware of the left renal findings in addition to the aortic findings -I relayed those by speaker phone. Electronically Signed   By: Van Clines M.D.   On: 06/21/2015 17:00   Ct Angio Abd/pel W/ And/or W/o  06/04/2015  CLINICAL DATA:  PT. COLLAPSED IN ED WAITING, SEIZURE, DR. ZAVITZ PERFORMED US AND SAW AAA, PT. SEND TO CT1 WITHOUT LABS, DR. DICKSON APPROVED NO LABS NEEDED IN URGENT SITUATION, 100 ML OMNI 350, LW^147mL OMNIPAQUE IOHEXOL 350 MG/ML SOLN EXAM: CT ANGIOGRAPHY CHEST, ABDOMEN AND PELVIS TECHNIQUE: Multidetector CT imaging through the chest, abdomen and pelvis was performed using the standard protocol during bolus administration of intravenous contrast. Multiplanar reconstructed images and MIPs were obtained and reviewed to evaluate the vascular anatomy. CONTRAST:  17mL OMNIPAQUE IOHEXOL 350 MG/ML SOLN COMPARISON:  CT 03/28/2005 FINDINGS: CT CHEST FINDINGS Heart: Heart is mildly enlarged. Coronary artery calcifications are present. No significant pericardial effusion. Vascular structures: Thoracic aorta is calcified about evidence for a aneurysm. At the level of the hemidiaphragm there is a atherosclerotic penetrating ulcer, best seen on image 92 of series 55. Great vessels are normally opacified. Mediastinum/thyroid: Insert thyroid E small, nonspecific mediastinal lymph nodes are identified, measuring less than 1 cm in short dimension. Esophagus twist all nausea and hearing and he Lungs/Airways: Emphysematous changes are present. There is a 10 x 9 mm nodule (9 mm  mean diameter) on image 26 of series 506. A calcified nodule is identified at the left lung base. A 3 mm nodule is identified at the left lung base on image 34 of series 506. No focal consolidations are identified. Right pleural effusion is present. Chest wall/osseous:  Unremarkable. CT ABDOMEN AND PELVIS FINDINGS Upper abdomen: Perfusion defects are identified within the level. No focal abnormality identified. Numerous splenic granulomata are present. The right adrenal gland is normal. Left adrenal gland is obscured by hematoma. See below. Gallbladder is present. Right kidney has a normal appearance. Left kidney is anteriorly displaced by and large retroperitoneal hematoma. Along the and very are old left kidney there is a large solid mass in measuring 7.6 x 10.2 x 7.8 cm. There is heterogeneous density within the mass, findings are suspicious for renal cell carcinoma. New Gastrointestinal tract: The stomach and small bowel loops are normal in configuration although displaced by retroperitoneal hematoma. The appendix is well  seen and has a normal appearance. Colonic loops contain numerous diverticula no evidence for acute diverticulitis. Colonic loops are decompressed. Pelvis: Prostatic calcifications are present. Urinary bladder is decompressed. No free pelvic fluid. Retroperitoneum: Significant increase in size of abdominal aortic aneurysm, measuring maximum diameter of 5.2 x 5.8 cm. There is active extravasation of contrast from the mid abdominal infrarenal aneurysm, consistent with active hemorrhage. Large retroperitoneal hematoma is identified adjacent to the aorta, extending into the left para renal space. There is significant mural thrombus at the level of the aorta rupture. Tortuous opacified common iliac arteries, internal and external iliac arteries, and femoral arteries bilaterally. Abdominal wall: Unremarkable. Osseous structures: Degenerative changes are identified in the thoracolumbar spine. No  suspicious lytic or blastic lesions are identified. Review of the MIP images confirms the above findings. IMPRESSION: 1. Abdominal aortic aneurysm with acute rupture and active extravasation into the left retroperitoneum. 2. 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 3. Large retroperitoneal hematoma displaces the kidney. 4. A large lower pole solid renal mass is suspicious for renal cell carcinoma. 5. Cardiomegaly and coronary artery disease. 6. Colonic diverticulosis. Critical Value/emergent results were called by telephone at the time of interpretation on 06/16/2015 at 4:45 pm to Dr. Elnora Morrison , who verbally acknowledged these results. The Electronically Signed   By: Nolon Nations M.D.   On: 06/11/2015 17:05     STUDIES:    CULTURES: None  ANTIBIOTICS: Ancef (periop)  SIGNIFICANT EVENTS:   LINES/TUBES: RIJ MAC in place 3/19  ETT 3/19 Foley 3/19  DISCUSSION: Mr. Nettles is a 66M s/p emergent repair of a ruptured abdominal aortic aneurysm with significant intraoperative transfusion requirements and post-op Hgb of 6.8. He is requiring pressor support and remains intubated.   Discussed at length at bedside with family and Dr. Scot Dock.  Pt worsening despite max efforts.  He had expressed his wishes prior to surgery for no dialysis, no "heroic measures".  Family is appreciative of the efforts thus far but feel as though he would not want further aggressive interventions.  They wish to "scale back" and transition to comfort, first weaning off pressors.  NO further escalation.  Wean off pressors and potential terminal extubation when family ready.    Nickolas Madrid, NP 01-Jul-2015  12:09 PM Pager: [phone] or [phone]  Attending Note:  82 year old male s/p AAA repair who is now intubated and on high dose pressors.  On exam, patient is completely unresponsive.  Conversation with Dr. Doren Custard and family decision to take off pressors but not vent.   Will place withdrawal orderset and start a morphine drip.  Once family is ready will d/c pressors but not vent.  The patient is critically ill with multiple organ systems failure and requires high complexity decision making for assessment and support, frequent evaluation and titration of therapies, application of advanced monitoring technologies and extensive interpretation of multiple databases.   Critical Care Time devoted to patient care services described in this note is  35  Minutes. This time reflects time of care of this signee Dr Jennet Maduro. This critical care time does not reflect procedure time, or teaching time or supervisory time of PA/NP/Med student/Med Resident etc but could involve care discussion time.  Rush Farmer, M.D. [facility] Pulmonary/Critical Care Medicine. Pager: [phone]. After hours pager: [phone].

## 2015-06-24 NOTE — Progress Notes (Signed)
CRITICAL VALUE ALERT  Critical value received:  K 6.5  Date of notification:  2015-06-22  Time of notification:  [id number]  Critical value read back:Yes.    Nurse who received alert:  Jake Bathe, RN  MD notified (1st page):  Dr. Nelda Marseille  Time of first page:    MD notified (2nd page):  Time of second page:  Responding MD:  Dr. Nelda Marseille  Time MD responded:    Dr. Nelda Marseille making rounds, aware of K, no interventions. Family has made the decision for the patient to receive comfort care.

## 2015-06-24 NOTE — Progress Notes (Signed)
240 mls of morphine wasted in sink with Benjaman Kindler, RN.  Rowe Pavy, RN

## 2015-06-24 NOTE — Progress Notes (Signed)
OT Cancellation Note  Patient Details Name: Manuel Armstrong MRN: [id number] DOB: 17-Aug-1919   Cancelled Treatment:    Reason Eval/Treat Not Completed: Patient not medically ready (Pt currently intubated and on pressors. Will follow.)  [name] 06-28-15, 8:21 AM

## 2015-06-24 NOTE — Progress Notes (Signed)
Dr. Doren Custard came and spoke to family at the bedside. The family has made the decision to transition to comfort care, first weaning off pressors, no further escalation. The family does not wish to extubate at this time. Family needs about 30 minutes to discuss with other family members about prognosis. They will return with a decision on when to start weaning pressors.  Rowe Pavy, RN

## 2015-06-24 NOTE — Progress Notes (Signed)
[place] Progress Note Patient Name: Manuel Armstrong DOB: 10-09-1919 MRN: [id number]   Date of Service  Jul 05, 2015  HPI/Events of Note  Notified by nurse of the patient's ongoing atrial fibrillation with mild rapid ventricular response. Currently on vasopressor infusion with Levophed.Potassium 5.8. Magnesium 1.7.  eICU Interventions  1. Transition from Levophed to Neo-Synephrine 2. Magnesium sulfate 1 g IV 1     Intervention Category Intermediate Interventions: Arrhythmia - evaluation and management  Tera Partridge 07/05/2015, 6:50 AM

## 2015-06-24 NOTE — Progress Notes (Signed)
eLink Physician-Brief Progress Note Patient Name: Manuel Armstrong DOB: [birth year] MRN: [id number]   Date of Service  03-Jul-2015  HPI/Events of Note  Mild hyperkalemia. Patient has IV fluids with potassium chloride. Worsening renal function.  eICU Interventions  1. DC maintenance IV fluid with potassium chloride 2. Repeat BMP@1100  hrs. today     Intervention Category Intermediate Interventions: Electrolyte abnormality - evaluation and management  Tera Partridge 2015/07/03, 5:55 AM

## 2015-06-24 NOTE — Progress Notes (Signed)
Chaplain met with family relating to the end of life care for [place]. The family is catholic and has called their priest for spiritual care and guidance. Please page Chaplain if the family is in any need of more spiritual or emotional support.   Thanks,  [place]

## 2015-06-24 NOTE — Progress Notes (Signed)
Pharmacy note: Zinacef  82 yo male s/p AAA repair on 3/19 on zinacef (q12h for 2 doses total) for antibiotic prophylaxis. SCr= 2.67 (up) and CrCl ~ 15-20.  Plan -With current renal function will only need one dose post op -Will change Zinacef to 1.5gm x1 dose -Will sign off. Please contact pharmacy with any other needs.  Thank you, Hildred Laser, Pharm D Jun 28, 2015 11:56 AM

## 2015-06-24 NOTE — Progress Notes (Signed)
[place] Progress Note Patient Name: Manuel Armstrong DOB: Jun 19, 1919 MRN: [id number]   Date of Service  2015/06/20  HPI/Events of Note  Contacted by nurse regarding ABG showing metabolic acidosis. PaO2 acceptable. PTH is acceptable.  eICU Interventions  Continue current level of care with dismal prognosis.     Intervention Category Major Interventions: Respiratory failure - evaluation and management  Tera Partridge 20-Jun-2015, 12:44 AM

## 2015-06-24 NOTE — Progress Notes (Signed)
   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 The clamp was below the renal arteries. 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 The patient had made it clear that he did not want dialysis. In addition, given his age if his kidney function does not improve his chances meaningful survival would be very small. Critical care medicine is following. If they feel that it would be helpful to get renal involved and certainly I would be happy to call them.  * CARDIAC: Patient requiring Levophed for blood pressure.   * PULMONARY: On full ventilatory support.  * GI/ NUTRITION: Abdomen markedly distended. Continue NG tube drainage.  * HEME: Was on Eliquis. Still anticoagulated. INR = 2.3 Continue to correct.   * Critically ill. The patient had made it clear that he did not want dialysis or extreme measures. The family also requested DO NOT RESUSCITATE.  SUBJECTIVE: Sedated on vent.  PHYSICAL EXAM: Filed Vitals:   07-01-2015 0500 07-01-2015 0515 07/01/15 0530 07/01/2015 0545  BP: 112/89 115/96 129/93   Pulse: 79  70   Temp: 99.1 F (37.3 C) 99.3 F (37.4 C) 99.5 F (37.5 C) 99.7 F (37.6 C)  TempSrc:      Resp: 12 14 12 15   Height:      Weight:      SpO2: 100%  100%    LUNGS: Good air exchange ABDOMEN: Abdomen markedly distended Both feet appear adequately perfused.  LABS: Lab Results  Component Value Date   WBC PENDING 01-Jul-2015   HGB 9.0* 07-01-15   HCT 26.1* 07/01/15   MCV 84.7 Jul 01, 2015   PLT PENDING July 01, 2015   Lab Results  Component Value Date   CREATININE 1.86* 07-01-15   Lab Results  Component Value Date   INR 2.33* 06/15/2015   CBG (last 3)   Recent Labs  06/01/2015 1532 06/05/2015 2347  GLUCAP 136* 95    Active Problems:   Ruptured abdominal aortic aneurysm (AAA)  ([place])    Gae Gallop [id number] 01-Jul-2015

## 2015-06-24 NOTE — Progress Notes (Signed)
Patients time of death 69, notified MD, post mortem checklist complete.  Rowe Pavy, RN

## 2015-06-24 DEATH — deceased

## 2015-06-26 ENCOUNTER — Other Ambulatory Visit: Payer: Self-pay | Admitting: Neurology

## 2015-08-24 ENCOUNTER — Encounter (INDEPENDENT_AMBULATORY_CARE_PROVIDER_SITE_OTHER): Payer: Medicare Other | Admitting: Ophthalmology

## 2017-04-28 IMAGING — CT CT CTA ABD/PEL W/CM AND/OR W/O CM
1 of 10 series · 1 of 36 positions shown · IV contrast (omnipaque)
Comparison: CT 03/28/2005

CLINICAL DATA: PT. COLLAPSED IN ED WAITING, SEIZURE, DR. BRUNEAU
PERFORMED US AND SAW AAA, PT. SEND TO CT1 WITHOUT LABS, DR. DELEON
APPROVED NO LABS NEEDED IN URGENT SITUATION, 100 ML OMNI 350,
DANIELA ROBERT^100mL OMNIPAQUE IOHEXOL 350 MG/ML SOLN

EXAM:
CT ANGIOGRAPHY CHEST, ABDOMEN AND PELVIS
TECHNIQUE: Multidetector CT imaging through the chest, abdomen and pelvis was
performed using the standard protocol during bolus administration of
intravenous contrast. Multiplanar reconstructed images and MIPs were
obtained and reviewed to evaluate the vascular anatomy.
CONTRAST:  100mL OMNIPAQUE IOHEXOL 350 MG/ML SOLN

[Series 300: locator · axial · 0.68mm/px · 1 of 1 slices shown]
[im 1/1  lung]
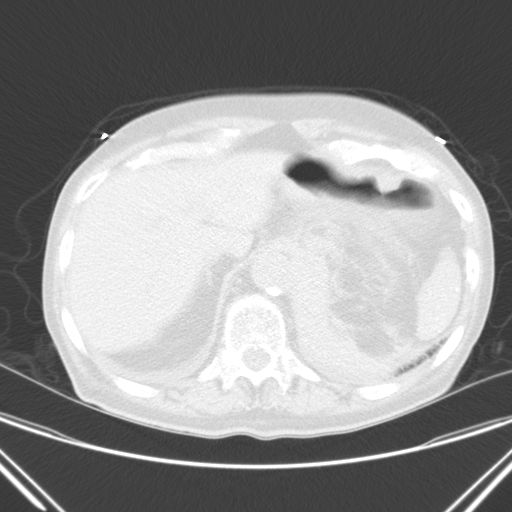

[1 of 36 positions shown; findings below may reference images not displayed]

FINDINGS: CT CHEST FINDINGS

Heart: Heart is mildly enlarged. Coronary artery calcifications are
present. No significant pericardial effusion.

Vascular structures: Thoracic aorta is calcified about evidence for
a aneurysm. At the level of the hemidiaphragm there is a
atherosclerotic penetrating ulcer, best seen on image 92 of series
55. Great vessels are normally opacified.

Mediastinum/thyroid: Insert thyroid E small, nonspecific mediastinal
lymph nodes are identified, measuring less than 1 cm in short
dimension. Esophagus twist all nausea and hearing and he

Lungs/Airways: Emphysematous changes are present. There is a 10 x 9
mm nodule (9 mm mean diameter) on image 26 of series 506. A
calcified nodule is identified at the left lung base. A 3 mm nodule
is identified at the left lung base on image 34 of series 506. No
focal consolidations are identified. Right pleural effusion is
present.

Chest wall/osseous:  Unremarkable.

CT ABDOMEN AND PELVIS FINDINGS

Upper abdomen: Perfusion defects are identified within the level. No
focal abnormality identified. Numerous splenic granulomata are
present. The right adrenal gland is normal. Left adrenal gland is
obscured by hematoma. See below. Gallbladder is present. Right
kidney has a normal appearance.

Left kidney is anteriorly displaced by and large retroperitoneal
hematoma. Along the and very are old left kidney there is a large
solid mass in measuring 7.6 x 10.2 x 7.8 cm. There is heterogeneous
density within the mass, findings are suspicious for renal cell
carcinoma. New

Gastrointestinal tract: The stomach and small bowel loops are normal
in configuration although displaced by retroperitoneal hematoma. The
appendix is well seen and has a normal appearance. Colonic loops
contain numerous diverticula no evidence for acute diverticulitis.
Colonic loops are decompressed.

Pelvis: Prostatic calcifications are present. Urinary bladder is
decompressed. No free pelvic fluid.

Retroperitoneum: Significant increase in size of abdominal aortic
aneurysm, measuring maximum diameter of 5.2 x 5.8 cm. There is
active extravasation of contrast from the mid abdominal infrarenal
aneurysm, consistent with active hemorrhage. Large retroperitoneal
hematoma is identified adjacent to the aorta, extending into the
left para renal space. There is significant mural thrombus at the
level of the aorta rupture. Tortuous opacified common iliac
arteries, internal and external iliac arteries, and femoral arteries
bilaterally.

Abdominal wall: Unremarkable.

Osseous structures: Degenerative changes are identified in the
thoracolumbar spine. No suspicious lytic or blastic lesions are
identified.

Review of the MIP images confirms the above findings.
IMPRESSION: 1. Abdominal aortic aneurysm with acute rupture and active
extravasation into the left retroperitoneum.
2. Penetrating atherosclerotic aortic ulcer at the level of the
diaphragm.
3. Large retroperitoneal hematoma displaces the kidney.
4. A large lower pole solid renal mass is suspicious for renal cell
carcinoma.
5. Cardiomegaly and coronary artery disease.
6. Colonic diverticulosis.

Critical Value/emergent results were called by telephone at the time
of interpretation on 06/11/2015 at [DATE] to Dr. MAYOURA ZHAR , who
verbally acknowledged these results. The
